# Patient Record
Sex: Male | Born: 1975
Health system: Southern US, Community
[De-identification: ages and names within clinical notes are randomized; demographics above are authoritative.]

## PROBLEM LIST (undated history)

## (undated) DIAGNOSIS — T63441A Toxic effect of venom of bees, accidental (unintentional), initial encounter: Secondary | ICD-10-CM

## (undated) HISTORY — PX: APPENDECTOMY: SHX54

---

## 2012-03-14 HISTORY — PX: ABCESS DRAINAGE: SHX399

## 2012-04-05 ENCOUNTER — Emergency Department (HOSPITAL_COMMUNITY)
Admission: EM | Admit: 2012-04-05 | Discharge: 2012-04-06 | Disposition: A | Discharge status: Home / Self Care | Payer: Self-pay | Attending: Emergency Medicine | Admitting: Emergency Medicine

## 2012-04-05 ENCOUNTER — Encounter (HOSPITAL_COMMUNITY): Payer: Self-pay | Admitting: Family Medicine

## 2012-04-05 DIAGNOSIS — Z79899 Other long term (current) drug therapy: Secondary | ICD-10-CM | POA: Insufficient documentation

## 2012-04-05 DIAGNOSIS — L02211 Cutaneous abscess of abdominal wall: Secondary | ICD-10-CM

## 2012-04-05 DIAGNOSIS — F17200 Nicotine dependence, unspecified, uncomplicated: Secondary | ICD-10-CM | POA: Insufficient documentation

## 2012-04-05 DIAGNOSIS — IMO0001 Reserved for inherently not codable concepts without codable children: Secondary | ICD-10-CM | POA: Insufficient documentation

## 2012-04-05 DIAGNOSIS — R509 Fever, unspecified: Secondary | ICD-10-CM | POA: Insufficient documentation

## 2012-04-05 DIAGNOSIS — L02219 Cutaneous abscess of trunk, unspecified: Secondary | ICD-10-CM | POA: Insufficient documentation

## 2012-04-05 DIAGNOSIS — R109 Unspecified abdominal pain: Secondary | ICD-10-CM | POA: Insufficient documentation

## 2012-04-05 MED ORDER — SODIUM CHLORIDE 0.9 % IV SOLN
Freq: Once | INTRAVENOUS | Status: AC
  Administered 2012-04-06: via INTRAVENOUS

## 2012-04-05 MED ORDER — VANCOMYCIN HCL IN DEXTROSE 1-5 GM/200ML-% IV SOLN
1000.0000 mg | Freq: Once | INTRAVENOUS | Status: AC
  Administered 2012-04-06: 1000 mg via INTRAVENOUS
  Filled 2012-04-05: qty 200

## 2012-04-05 MED ORDER — ONDANSETRON HCL 4 MG/2ML IJ SOLN
4.0000 mg | Freq: Once | INTRAMUSCULAR | Status: AC
  Administered 2012-04-06: 4 mg via INTRAVENOUS
  Filled 2012-04-05: qty 2

## 2012-04-05 MED ORDER — HYDROMORPHONE HCL PF 1 MG/ML IJ SOLN
1.0000 mg | Freq: Once | INTRAMUSCULAR | Status: AC
  Administered 2012-04-06: 1 mg via INTRAVENOUS
  Filled 2012-04-05: qty 1

## 2012-04-05 NOTE — ED Provider Notes (Signed)
History     CSN: [id number]  Arrival date & time 04/05/12  2115   First MD Initiated Contact with Patient 04/05/12 2255      Chief Complaint  Patient presents with  . Abscess    (Consider location/radiation/quality/duration/timing/severity/associated sxs/prior treatment) HPI Comments: Patient was initially treated October 8 for a abscess on his left forearm, which is I indeed, he returns to [facility] on the 21st with numerous other small punctate lesions, and a larger abscess in the anterior midline.  Abdomen has become very painful.  His girlfriend opened up the superficial with some drainage.  Pain, has increased and patient now has myalgias, and fevers.  Has been unable to go to work for the past 2, days, 2 to extreme fatigue, and fever to 101, despite taking the second course of Septra  Patient is a 36 y.o. male presenting with abscess. The history is provided by the patient.  Abscess  This is a recurrent problem. The current episode started more than one week ago. The problem has been gradually worsening. The abscess is present on the abdomen. The abscess is characterized by itchiness and swelling. The patient was exposed to antibiotics. Associated symptoms include a fever. Pertinent negatives include no diarrhea and no vomiting.    History reviewed. No pertinent past medical history.  Past Surgical History  Procedure Date  . Appendectomy     No family history on file.  History  Substance Use Topics  . Smoking status: Current Every Day Smoker -- 0.5 packs/day    Types: Cigarettes  . Smokeless tobacco: Not on file  . Alcohol Use: No      Review of Systems  Constitutional: Positive for fever, chills and activity change.  Gastrointestinal: Positive for abdominal pain. Negative for vomiting, diarrhea and constipation.  Genitourinary: Negative for dysuria.  Musculoskeletal: Positive for myalgias.  Skin: Positive for wound.  Neurological: Negative for dizziness.     Allergies  Amoxicillin; Bee venom; and Penicillins  Home Medications   Current Outpatient Rx  Name Route Sig Dispense Refill  . HYDROCODONE-ACETAMINOPHEN 5-325 MG PO TABS Oral Take 1 tablet by mouth every 6 (six) hours as needed. pain    . SULFAMETHOXAZOLE-TMP DS 800-160 MG PO TABS Oral Take 1 tablet by mouth 2 (two) times daily.    Marland Kitchen MINOCYCLINE HCL 100 MG PO CAPS Oral Take 1 capsule (100 mg total) by mouth 2 (two) times daily. 14 capsule 0  . OXYCODONE-ACETAMINOPHEN 5-325 MG PO TABS Oral Take 1-2 tablets by mouth every 6 (six) hours as needed for pain. 20 tablet 0    BP 135/87  Pulse 79  Temp 98.3 F (36.8 C) (Oral)  Resp 20  SpO2 98%  Physical Exam  Constitutional: He appears well-developed and well-nourished.  HENT:  Head: Normocephalic.  Eyes: Pupils are equal, round, and reactive to light.  Neck: Normal range of motion.  Cardiovascular: Normal rate.   Pulmonary/Chest: Effort normal.  Abdominal: Soft. He exhibits no distension. There is tenderness.    Musculoskeletal: Normal range of motion.  Neurological: He is alert.  Skin:       Numerous small, punctate superficial abscesses along the lateral aspect of the left upper arm, left forearm, anterior left thigh    ED Course  INCISION AND DRAINAGE Date/Time: 04/06/2012 2:09 AM Performed by: Arman Filter Authorized by: Arman Filter Consent: Verbal consent obtained. Risks and benefits: risks, benefits and alternatives were discussed Consent given by: patient Patient understanding: patient states understanding of  the procedure being performed Patient identity confirmed: verbally with patient Time out: Immediately prior to procedure a "time out" was called to verify the correct patient, procedure, equipment, support staff and site/side marked as required. Type: abscess Body area: trunk Location details: abdomen Anesthesia: local infiltration Local anesthetic: lidocaine 1% without epinephrine Anesthetic  total: 5 ml Patient sedated: no Scalpel size: 11 Needle gauge: 20 Incision type: elliptical Complexity: simple Drainage: purulent Drainage amount: moderate Wound treatment: wound left open Patient tolerance: Patient tolerated the procedure well with no immediate complications.   (including critical care time)  Labs Reviewed  POCT I-STAT, CHEM 8 - Abnormal; Notable for the following:    Potassium 5.5 (*)     Glucose, Bld 108 (*)     Calcium, Ion 1.10 (*)     All other components within normal limits  CBC  POTASSIUM   No results found.   1. Abscess of abdominal wall       MDM  Maisie Fus is I indeed, drained.  Moderate amount of pertinent material removed.  Left open.  I have added medicine to his Septra was encouraged patient to finish taking both.  Also given him Percocet for pain         Arman Filter, NP 04/06/12 [phone]

## 2012-04-05 NOTE — ED Notes (Addendum)
Patient here for evaluation of multiple abscesses. Was seen at [facility] on 10/8 and again on 10/21. Was given Rx for Bactrim for 2 weeks. Completed one course of Bactrim and and was restarted on Bactrim again the the 21st. Raised area to lower abdomen with purulent center. Abscesses to left thigh and left forearm. Denies fever but reports pain, fatigue and weakness.

## 2012-04-06 LAB — CBC
Hemoglobin: 15.2 g/dL (ref 13.0–17.0)
MCH: 30.5 pg (ref 26.0–34.0)
MCV: 87.6 fL (ref 78.0–100.0)
RBC: 4.99 MIL/uL (ref 4.22–5.81)

## 2012-04-06 LAB — POCT I-STAT, CHEM 8
BUN: 6 mg/dL (ref 6–23)
Calcium, Ion: 1.1 mmol/L — ABNORMAL LOW (ref 1.12–1.23)
Creatinine, Ser: 0.9 mg/dL (ref 0.50–1.35)
TCO2: 25 mmol/L (ref 0–100)

## 2012-04-06 MED ORDER — OXYCODONE-ACETAMINOPHEN 5-325 MG PO TABS: 1.0000 | ORAL_TABLET | Freq: Four times a day (QID) | ORAL | Status: DC | PRN

## 2012-04-06 MED ORDER — MINOCYCLINE HCL 100 MG PO CAPS: 100.0000 mg | ORAL_CAPSULE | Freq: Two times a day (BID) | ORAL | Status: DC

## 2012-04-06 MED ORDER — LORAZEPAM 2 MG/ML IJ SOLN: 1.0000 mg | Freq: Once | INTRAMUSCULAR | Status: DC

## 2012-04-06 NOTE — ED Provider Notes (Signed)
Medical screening examination/treatment/procedure(s) were performed by non-physician practitioner and as supervising physician I was immediately available for consultation/collaboration.   Kwana Ringel L Cassie Shedlock, MD 04/06/12 0632 

## 2012-04-06 NOTE — ED Notes (Signed)
NP at bedside.

## 2012-08-21 ENCOUNTER — Emergency Department (HOSPITAL_COMMUNITY)
Admission: EM | Admit: 2012-08-21 | Discharge: 2012-08-22 | Disposition: A | Discharge status: Home / Self Care | Payer: Self-pay | Attending: Emergency Medicine | Admitting: Emergency Medicine

## 2012-08-21 ENCOUNTER — Encounter (HOSPITAL_COMMUNITY): Payer: Self-pay | Admitting: *Deleted

## 2012-08-21 DIAGNOSIS — L0291 Cutaneous abscess, unspecified: Secondary | ICD-10-CM

## 2012-08-21 DIAGNOSIS — L02219 Cutaneous abscess of trunk, unspecified: Secondary | ICD-10-CM | POA: Insufficient documentation

## 2012-08-21 DIAGNOSIS — R42 Dizziness and giddiness: Secondary | ICD-10-CM | POA: Insufficient documentation

## 2012-08-21 DIAGNOSIS — L03319 Cellulitis of trunk, unspecified: Secondary | ICD-10-CM | POA: Insufficient documentation

## 2012-08-21 DIAGNOSIS — F17200 Nicotine dependence, unspecified, uncomplicated: Secondary | ICD-10-CM | POA: Insufficient documentation

## 2012-08-21 NOTE — ED Notes (Signed)
Pt complains of headaches with dizziness for the past week. Denies vomiting but complains of nausea. Also pt notices a sore, tender area to the LLQ/upper left pelvic area. Complains of redness and edematous in nature as well. Green drainage noted by pt and family

## 2012-08-22 MED ORDER — IBUPROFEN 800 MG PO TABS
800.0000 mg | ORAL_TABLET | Freq: Once | ORAL | Status: AC
  Administered 2012-08-22: 800 mg via ORAL
  Filled 2012-08-22: qty 1

## 2012-08-22 MED ORDER — OXYCODONE-ACETAMINOPHEN 5-325 MG PO TABS
2.0000 | ORAL_TABLET | Freq: Once | ORAL | Status: AC
  Administered 2012-08-22: 2 via ORAL
  Filled 2012-08-22: qty 2

## 2012-08-22 MED ORDER — OXYCODONE-ACETAMINOPHEN 5-325 MG PO TABS: 2.0000 | ORAL_TABLET | Freq: Four times a day (QID) | ORAL | Status: DC | PRN
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
## 2012-08-22 NOTE — ED Notes (Signed)
NP at bedside.

## 2012-08-22 NOTE — ED Provider Notes (Signed)
Medical screening examination/treatment/procedure(s) were performed by non-physician practitioner and as supervising physician I was immediately available for consultation/collaboration.  Nechama Escutia, MD 08/22/12 0920 

## 2013-01-15 ENCOUNTER — Emergency Department (HOSPITAL_COMMUNITY)
Admission: EM | Admit: 2013-01-15 | Discharge: 2013-01-15 | Disposition: A | Discharge status: Home / Self Care | Payer: Self-pay | Attending: Emergency Medicine | Admitting: Emergency Medicine

## 2013-01-15 ENCOUNTER — Encounter (HOSPITAL_COMMUNITY): Payer: Self-pay

## 2013-01-15 DIAGNOSIS — W208XXA Other cause of strike by thrown, projected or falling object, initial encounter: Secondary | ICD-10-CM | POA: Insufficient documentation

## 2013-01-15 DIAGNOSIS — F17200 Nicotine dependence, unspecified, uncomplicated: Secondary | ICD-10-CM | POA: Insufficient documentation

## 2013-01-15 DIAGNOSIS — IMO0002 Reserved for concepts with insufficient information to code with codable children: Secondary | ICD-10-CM | POA: Insufficient documentation

## 2013-01-15 DIAGNOSIS — Z880 Allergy status to penicillin: Secondary | ICD-10-CM | POA: Insufficient documentation

## 2013-01-15 DIAGNOSIS — Z9889 Other specified postprocedural states: Secondary | ICD-10-CM | POA: Insufficient documentation

## 2013-01-15 DIAGNOSIS — S39011A Strain of muscle, fascia and tendon of abdomen, initial encounter: Secondary | ICD-10-CM

## 2013-01-15 DIAGNOSIS — Y9389 Activity, other specified: Secondary | ICD-10-CM | POA: Insufficient documentation

## 2013-01-15 DIAGNOSIS — S298XXA Other specified injuries of thorax, initial encounter: Secondary | ICD-10-CM | POA: Insufficient documentation

## 2013-01-15 DIAGNOSIS — Y9269 Other specified industrial and construction area as the place of occurrence of the external cause: Secondary | ICD-10-CM | POA: Insufficient documentation

## 2013-01-15 MED ORDER — IBUPROFEN 800 MG PO TABS: 800.0000 mg | ORAL_TABLET | Freq: Three times a day (TID) | ORAL | Status: DC

## 2013-01-15 MED ORDER — OXYCODONE-ACETAMINOPHEN 5-325 MG PO TABS: 2.0000 | ORAL_TABLET | ORAL | Status: DC | PRN

## 2013-01-15 MED ORDER — METHOCARBAMOL 500 MG PO TABS: 500.0000 mg | ORAL_TABLET | Freq: Three times a day (TID) | ORAL | Status: DC | PRN

## 2013-01-15 MED ORDER — MORPHINE SULFATE 10 MG/ML IJ SOLN
10.0000 mg | Freq: Once | INTRAMUSCULAR | Status: AC
  Administered 2013-01-15: 10 mg via INTRAMUSCULAR
  Filled 2013-01-15: qty 1

## 2013-01-15 MED ORDER — ONDANSETRON 4 MG PO TBDP
4.0000 mg | ORAL_TABLET | Freq: Once | ORAL | Status: AC
  Administered 2013-01-15: 4 mg via ORAL
  Filled 2013-01-15: qty 1

## 2013-01-15 NOTE — ED Notes (Signed)
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

## 2013-01-15 NOTE — ED Provider Notes (Signed)
  CSN: [id number]     Arrival date & time 01/15/13  1941 History     First MD Initiated Contact with Patient 01/15/13 1956     Chief Complaint  Patient presents with  . Flank Pain   ( HPI  Pt works doing Aeronautical engineer.  Holding 10', 30 plb pruner upright 2 days ago.  Tool fell to pts left, and he resisted, and felt pain and burning ir RUQ sub-costal abd.  Pain with cough, bend, twist.  No symptoms prior to injury.  No cough, fever, food intolerance. No rash or vesicles.  History reviewed. No pertinent past medical history. Past Surgical History  Procedure Laterality Date  . Appendectomy    . Abcess drainage  03/2012   History reviewed. No pertinent family history. History  Substance Use Topics  . Smoking status: Current Every Day Smoker -- 0.50 packs/day for 20 years    Types: Cigarettes  . Smokeless tobacco: Not on file  . Alcohol Use: No    Review of Systems  Cardiovascular: Positive for chest pain.  Gastrointestinal: Positive for abdominal pain. Negative for nausea and diarrhea.  Musculoskeletal: Positive for myalgias. Negative for back pain and gait problem.  Skin: Positive for rash.  Neurological: Negative for weakness and numbness.    Allergies  Amoxicillin; Bee venom; and Penicillins  Home Medications   Current Outpatient Rx  Name  Route  Sig  Dispense  Refill  . ibuprofen (ADVIL,MOTRIN) 800 MG tablet   Oral   Take 1 tablet (800 mg total) by mouth 3 (three) times daily.   21 tablet   0   . methocarbamol (ROBAXIN) 500 MG tablet   Oral   Take 1 tablet (500 mg total) by mouth 3 (three) times daily between meals as needed.   20 tablet   0   . oxyCODONE-acetaminophen (PERCOCET/ROXICET) 5-325 MG per tablet   Oral   Take 2 tablets by mouth every 4 (four) hours as needed for pain.   6 tablet   0    BP 140/93  Pulse 102  Temp(Src) 98.5 F (36.9 C) (Oral)  Resp 20  Ht 5\' 8"  (1.727 m)  Wt 200 lb (90.719 kg)  BMI 30.42 kg/m2  SpO2 99% Physical Exam    Constitutional: He is oriented to person, place, and time. He appears well-developed and well-nourished. No distress.  HENT:  Head: Normocephalic.  Eyes: Conjunctivae are normal. Pupils are equal, round, and reactive to light. No scleral icterus.  Neck: Normal range of motion. Neck supple.  Cardiovascular: Normal rate and regular rhythm.  Exam reveals no gallop and no friction rub.   No murmur heard. Pulmonary/Chest: Effort normal and breath sounds normal. No respiratory distress. He has no wheezes. He has no rales.  Abdominal: Soft. Bowel sounds are normal. He exhibits no distension. There is tenderness. There is no rebound.    Musculoskeletal: Normal range of motion.  Neurological: He is alert and oriented to person, place, and time.  Skin: Skin is warm and dry. No rash noted.  Psychiatric: He has a normal mood and affect. His behavior is normal.    ED Course   Procedures (including critical care time)  Labs Reviewed - No data to display No results found. 1. Strain of abdominal wall, initial encounter     MDM  No studies indicated.  History and exam consistent with oblique/abd wall strain.  Claudean Kinds, MD 01/15/13 2041

## 2013-06-10 ENCOUNTER — Emergency Department (HOSPITAL_COMMUNITY): Payer: Self-pay

## 2013-06-10 ENCOUNTER — Emergency Department (HOSPITAL_COMMUNITY)
Admission: EM | Admit: 2013-06-10 | Discharge: 2013-06-11 | Disposition: A | Discharge status: Home / Self Care | Payer: Self-pay | Attending: Emergency Medicine | Admitting: Emergency Medicine

## 2013-06-10 ENCOUNTER — Encounter (HOSPITAL_COMMUNITY): Payer: Self-pay | Admitting: Emergency Medicine

## 2013-06-10 DIAGNOSIS — S0993XA Unspecified injury of face, initial encounter: Secondary | ICD-10-CM | POA: Insufficient documentation

## 2013-06-10 DIAGNOSIS — R1310 Dysphagia, unspecified: Secondary | ICD-10-CM | POA: Insufficient documentation

## 2013-06-10 DIAGNOSIS — Y9383 Activity, rough housing and horseplay: Secondary | ICD-10-CM | POA: Insufficient documentation

## 2013-06-10 DIAGNOSIS — Z880 Allergy status to penicillin: Secondary | ICD-10-CM | POA: Insufficient documentation

## 2013-06-10 DIAGNOSIS — R609 Edema, unspecified: Secondary | ICD-10-CM | POA: Insufficient documentation

## 2013-06-10 DIAGNOSIS — IMO0002 Reserved for concepts with insufficient information to code with codable children: Secondary | ICD-10-CM | POA: Insufficient documentation

## 2013-06-10 DIAGNOSIS — S199XXA Unspecified injury of neck, initial encounter: Secondary | ICD-10-CM | POA: Insufficient documentation

## 2013-06-10 DIAGNOSIS — F17200 Nicotine dependence, unspecified, uncomplicated: Secondary | ICD-10-CM | POA: Insufficient documentation

## 2013-06-10 DIAGNOSIS — Y92009 Unspecified place in unspecified non-institutional (private) residence as the place of occurrence of the external cause: Secondary | ICD-10-CM | POA: Insufficient documentation

## 2013-06-10 LAB — COMPREHENSIVE METABOLIC PANEL
AST: 23 U/L (ref 0–37)
BUN: 10 mg/dL (ref 6–23)
CO2: 28 mEq/L (ref 19–32)
Calcium: 9.4 mg/dL (ref 8.4–10.5)
Creatinine, Ser: 0.71 mg/dL (ref 0.50–1.35)
GFR calc non Af Amer: >90 mL/min (ref >90)

## 2013-06-10 LAB — CBC WITH DIFFERENTIAL/PLATELET
Basophils Absolute: 0 10*3/uL (ref 0.0–0.1)
Basophils Relative: 0 % (ref 0–1)
Eosinophils Relative: 2 % (ref 0–5)
HCT: 45.1 % (ref 39.0–52.0)
Lymphocytes Relative: 17 % (ref 12–46)
MCHC: 34.6 g/dL (ref 30.0–36.0)
MCV: 89.5 fL (ref 78.0–100.0)
Monocytes Absolute: 1.2 10*3/uL — ABNORMAL HIGH (ref 0.1–1.0)
RDW: 13.5 % (ref 11.5–15.5)

## 2013-06-10 MED ORDER — MORPHINE SULFATE 4 MG/ML IJ SOLN
4.0000 mg | Freq: Once | INTRAMUSCULAR | Status: AC
  Administered 2013-06-10: 4 mg via INTRAVENOUS
  Filled 2013-06-10: qty 1

## 2013-06-10 MED ORDER — IOHEXOL 350 MG/ML SOLN
100.0000 mL | Freq: Once | INTRAVENOUS | Status: AC | PRN
  Administered 2013-06-10: 100 mL via INTRAVENOUS

## 2013-06-10 MED ORDER — ONDANSETRON HCL 4 MG/2ML IJ SOLN
4.0000 mg | Freq: Once | INTRAMUSCULAR | Status: AC
  Administered 2013-06-10: 4 mg via INTRAVENOUS
  Filled 2013-06-10: qty 2
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
## 2013-06-10 NOTE — ED Provider Notes (Signed)
Medical screening examination/treatment/procedure(s) were conducted as a shared visit with non-physician practitioner(s) and myself.  I personally evaluated the patient during the encounter.  EKG Interpretation   None      No stridor drooling or dysphasia normal phonation no shortness of breath no change in vision no strokelike symptoms but does have tender hematoma right anterior triangle of neck with midline trachea  Hurman Horn, MD 06/11/13 2028
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
## 2013-06-11 MED ORDER — HYDROCODONE-ACETAMINOPHEN 5-325 MG PO TABS: 2.0000 | ORAL_TABLET | ORAL | Status: DC | PRN

## 2013-12-07 ENCOUNTER — Emergency Department (HOSPITAL_COMMUNITY)
Admission: EM | Admit: 2013-12-07 | Discharge: 2013-12-07 | Disposition: A | Discharge status: Home / Self Care | Payer: Self-pay | Attending: Emergency Medicine | Admitting: Emergency Medicine

## 2013-12-07 ENCOUNTER — Encounter (HOSPITAL_COMMUNITY): Payer: Self-pay | Admitting: Emergency Medicine

## 2013-12-07 DIAGNOSIS — T63461A Toxic effect of venom of wasps, accidental (unintentional), initial encounter: Secondary | ICD-10-CM | POA: Insufficient documentation

## 2013-12-07 DIAGNOSIS — Y9389 Activity, other specified: Secondary | ICD-10-CM | POA: Insufficient documentation

## 2013-12-07 DIAGNOSIS — R21 Rash and other nonspecific skin eruption: Secondary | ICD-10-CM | POA: Insufficient documentation

## 2013-12-07 DIAGNOSIS — T7840XA Allergy, unspecified, initial encounter: Secondary | ICD-10-CM

## 2013-12-07 DIAGNOSIS — Z880 Allergy status to penicillin: Secondary | ICD-10-CM | POA: Insufficient documentation

## 2013-12-07 DIAGNOSIS — Y9289 Other specified places as the place of occurrence of the external cause: Secondary | ICD-10-CM | POA: Insufficient documentation

## 2013-12-07 DIAGNOSIS — T6391XA Toxic effect of contact with unspecified venomous animal, accidental (unintentional), initial encounter: Secondary | ICD-10-CM | POA: Insufficient documentation

## 2013-12-07 DIAGNOSIS — Z881 Allergy status to other antibiotic agents status: Secondary | ICD-10-CM | POA: Insufficient documentation

## 2013-12-07 DIAGNOSIS — IMO0002 Reserved for concepts with insufficient information to code with codable children: Secondary | ICD-10-CM | POA: Insufficient documentation

## 2013-12-07 DIAGNOSIS — Z79899 Other long term (current) drug therapy: Secondary | ICD-10-CM | POA: Insufficient documentation

## 2013-12-07 DIAGNOSIS — F17200 Nicotine dependence, unspecified, uncomplicated: Secondary | ICD-10-CM | POA: Insufficient documentation

## 2013-12-07 HISTORY — DX: Toxic effect of venom of bees, accidental (unintentional), initial encounter: T63.441A

## 2013-12-07 MED ORDER — EPINEPHRINE 0.3 MG/0.3ML IJ SOAJ: 0.3000 mg | INTRAMUSCULAR | Status: AC | PRN

## 2013-12-07 MED ORDER — FAMOTIDINE IN NACL 20-0.9 MG/50ML-% IV SOLN
20.0000 mg | Freq: Once | INTRAVENOUS | Status: AC
  Administered 2013-12-07: 20 mg via INTRAVENOUS
  Filled 2013-12-07: qty 50

## 2013-12-07 MED ORDER — FAMOTIDINE 20 MG PO TABS: 20.0000 mg | ORAL_TABLET | Freq: Two times a day (BID) | ORAL | Status: DC

## 2013-12-07 MED ORDER — PREDNISONE 50 MG PO TABS: ORAL_TABLET | ORAL | Status: DC

## 2013-12-07 MED ORDER — METHYLPREDNISOLONE SODIUM SUCC 125 MG IJ SOLR
125.0000 mg | Freq: Once | INTRAMUSCULAR | Status: AC
Start: 2013-12-07 — End: 2013-12-07
  Administered 2013-12-07: 125 mg via INTRAVENOUS
  Filled 2013-12-07: qty 2

## 2013-12-07 MED ORDER — EPINEPHRINE 0.3 MG/0.3ML IJ SOAJ
0.3000 mg | Freq: Once | INTRAMUSCULAR | Status: DC
  Filled 2013-12-07: qty 0.3

## 2013-12-07 MED ORDER — DIPHENHYDRAMINE HCL 50 MG/ML IJ SOLN
25.0000 mg | Freq: Once | INTRAMUSCULAR | Status: AC
  Administered 2013-12-07: 25 mg via INTRAVENOUS
  Filled 2013-12-07: qty 1

## 2013-12-07 MED ORDER — EPINEPHRINE 0.3 MG/0.3ML IJ SOAJ
INTRAMUSCULAR | Status: AC
  Administered 2013-12-07: 0.3 mg
  Filled 2013-12-07: qty 0.3

## 2013-12-07 MED ORDER — SODIUM CHLORIDE 0.9 % IV BOLUS (SEPSIS)
1000.0000 mL | Freq: Once | INTRAVENOUS | Status: AC
  Administered 2013-12-07: 1000 mL via INTRAVENOUS

## 2013-12-07 MED ORDER — DIPHENHYDRAMINE HCL 25 MG PO TABS: 25.0000 mg | ORAL_TABLET | Freq: Four times a day (QID) | ORAL | Status: DC

## 2013-12-07 NOTE — Discharge Instructions (Signed)
Anaphylactic Reaction °Take the steroids antihistamines as prescribed. followup with your doctor. Use the epinephrine pen as needed for severe allergic reaction. If you use the epinephrine pain, you must come to the hospital. Return to the ED if you develop or worsening symptoms. °An anaphylactic reaction is a sudden, severe allergic reaction that involves the whole body. It can be life threatening. A hospital stay is often required. People with asthma, eczema, or hay fever are slightly more likely to have an anaphylactic reaction. °CAUSES  °An anaphylactic reaction may be caused by anything to which you are allergic. After being exposed to the allergic substance, your immune system becomes sensitized to it. When you are exposed to that allergic substance again, an allergic reaction can occur. Common causes of an anaphylactic reaction include: °· Medicines. °· Foods, especially peanuts, wheat, shellfish, milk, and eggs. °· Insect bites or stings. °· Blood products. °· Chemicals, such as dyes, latex, and contrast material used for imaging tests. °SYMPTOMS  °When an allergic reaction occurs, the body releases histamine and other substances. These substances cause symptoms such as tightening of the airway. Symptoms often develop within seconds or minutes of exposure. Symptoms may include: °· Skin rash or hives. °· Itching. °· Chest tightness. °· Swelling of the eyes, tongue, or lips. °· Trouble breathing or swallowing. °· Lightheadedness or fainting. °· Anxiety or confusion. °· Stomach pains, vomiting, or diarrhea. °· Nasal congestion. °· A fast or irregular heartbeat (palpitations). °DIAGNOSIS  °Diagnosis is based on your history of recent exposure to allergic substances, your symptoms, and a physical exam. Your caregiver may also perform blood or urine tests to confirm the diagnosis. °TREATMENT  °Epinephrine medicine is the main treatment for an anaphylactic reaction. Other medicines that may be used for treatment  include antihistamines, steroids, and albuterol. In severe cases, fluids and medicine to support blood pressure may be given through an intravenous line (IV). Even if you improve after treatment, you need to be observed to make sure your condition does not get worse. This may require a stay in the hospital. °HOME CARE INSTRUCTIONS  °· Wear a medical alert bracelet or necklace stating your allergy. °· You and your family must learn how to use an anaphylaxis kit or give an epinephrine injection to temporarily treat an emergency allergic reaction. Always carry your epinephrine injection or anaphylaxis kit with you. This can be lifesaving if you have a severe reaction. °· Do not drive or perform tasks after treatment until the medicines used to treat your reaction have worn off, or until your caregiver says it is okay. °· If you have hives or a rash: °¨ Take medicines as directed by your caregiver. °¨ You may use an over-the-counter antihistamine (diphenhydramine) as needed. °¨ Apply cold compresses to the skin or take baths in cool water. Avoid hot baths or showers. °SEEK MEDICAL CARE IF:  °· You develop symptoms of an allergic reaction to a new substance. Symptoms may start right away or minutes later. °· You develop a rash, hives, or itching. °· You develop new symptoms. °SEEK IMMEDIATE MEDICAL CARE IF:  °· You have swelling of the mouth, difficulty breathing, or wheezing. °· You have a tight feeling in your chest or throat. °· You develop hives, swelling, or itching all over your body. °· You develop severe vomiting or diarrhea. °· You feel faint or pass out. °This is an emergency. Use your epinephrine injection or anaphylaxis kit as you have been instructed. Call your local emergency services (911   in [place] Even if you improve after the injection, you need to be examined at a hospital emergency department. °MAKE SURE YOU:  °· Understand these instructions. °· Will watch your condition. °· Will get help right away  if you are not doing well or get worse. °Document Released: 05/31/2005 Document Revised: 06/05/2013 Document Reviewed: 09/01/2011 °[employer]® Patient Information ©2015 [employer]. This information is not intended to replace advice given to you by your health care provider. Make sure you discuss any questions you have with your health care provider. ° °
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
## 2013-12-07 NOTE — ED Provider Notes (Signed)
CSN: [id number]     Arrival date & time 12/07/13  1021 History   First MD Initiated Contact with Patient 12/07/13 1025     Chief Complaint  Patient presents with  . Allergic Reaction     (Consider location/radiation/quality/duration/timing/severity/associated sxs/prior Treatment) HPI Comments: Patient presents after bee sting 20 minutes ago. 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 He did not use his EpiPen because it was expired. He complains of tightness in his throat and generalized itching. Denies any chest pain or shortness of breath. Denies any fever or vomiting. Previous reactions include throat swelling and rash.  The history is provided by the patient.    Past Medical History  Diagnosis Date  . Allergic reaction to bee sting    Past Surgical History  Procedure Laterality Date  . Appendectomy    . Abcess drainage  03/2012   No family history on file. History  Substance Use Topics  . Smoking status: Current Every Day Smoker -- 0.50 packs/day for 20 years    Types: Cigarettes  . Smokeless tobacco: Not on file  . Alcohol Use: No    Review of Systems  Constitutional: Negative for fever, activity change and appetite change.  Respiratory: Negative for cough, chest tightness and shortness of breath.   Cardiovascular: Negative for chest pain.  Gastrointestinal: Negative for nausea, vomiting and abdominal pain.  Musculoskeletal: Negative for arthralgias and myalgias.  Skin: Positive for rash.  Neurological: Negative for dizziness, weakness and headaches.  A complete 10 system review of systems was obtained and all systems are negative except as noted in the HPI and PMH.      Allergies  Amoxicillin; Bee venom; and Penicillins  Home Medications   Prior to Admission medications   Medication Sig Start Date End Date Taking? Authorizing Provider  EPINEPHrine (EPIPEN) 0.3 mg/0.3 mL IJ SOAJ injection Inject 0.3 mg into the muscle once.   Yes  Historical Provider, MD  ibuprofen (ADVIL,MOTRIN) 200 MG tablet Take 400 mg by mouth every 6 (six) hours as needed for headache (pain).    Yes Historical Provider, MD  diphenhydrAMINE (BENADRYL) 25 MG tablet Take 1 tablet (25 mg total) by mouth every 6 (six) hours. 12/07/13   Glynn OctaveStephen Rancour, MD  EPINEPHrine (EPIPEN) 0.3 mg/0.3 mL IJ SOAJ injection Inject 0.3 mLs (0.3 mg total) into the muscle as needed. 12/07/13   Glynn OctaveStephen Rancour, MD  famotidine (PEPCID) 20 MG tablet Take 1 tablet (20 mg total) by mouth 2 (two) times daily. 12/07/13   Glynn OctaveStephen Rancour, MD  predniSONE (DELTASONE) 50 MG tablet 1 tablet PO daily 12/07/13   Glynn OctaveStephen Rancour, MD   BP 132/88  Pulse 54  Temp(Src) 98.2 F (36.8 C) (Oral)  Resp 17  SpO2 99% Physical Exam  Nursing note and vitals reviewed. Constitutional: He is oriented to person, place, and time. He appears well-developed and well-nourished. No distress.  HENT:  Head: Normocephalic and atraumatic.  Mouth/Throat: Oropharynx is clear and moist. No oropharyngeal exudate.  OP Clear. No asymmetry. No swelling of lips or tongue  Eyes: Conjunctivae and EOM are normal. Pupils are equal, round, and reactive to light.  Neck: Normal range of motion. Neck supple.  No meningismus.  Cardiovascular: Normal rate, regular rhythm, normal heart sounds and intact distal pulses.   No murmur heard. Pulmonary/Chest: Effort normal and breath sounds normal. No respiratory distress. He has no wheezes.  Abdominal: Soft. There is no tenderness. There is no rebound and no guarding.  Musculoskeletal: Normal range of motion. He exhibits no edema and no tenderness.  Neurological: He is alert and oriented to person, place, and time. No cranial nerve deficit. He exhibits normal muscle tone. Coordination normal.  No ataxia on finger to nose bilaterally. No pronator drift. 5/5 strength throughout. CN 2-12 intact. Negative Romberg. Equal grip strength. Sensation intact. Gait is normal.   Skin: Skin is  warm. Rash noted.  Scattered urticaria to trunk  Psychiatric: He has a normal mood and affect. His behavior is normal.    ED Course  Procedures (including critical care time) Labs Review Labs Reviewed - No data to display  Imaging Review No results found.   EKG Interpretation None      MDM   Final diagnoses:  Allergic reaction, initial encounter   Allergic reaction bee sting. Throat tightness and urticarial rash.  Patient given IM epinephrine, steroids, antihistamines. IV fluids.  Patient monitored in the ED for 3 hours without deterioration in status.  Throat feels better. No chest pain or SOB. No wheezing.  Appears safe for discharge with steroids and antihistamines.  Epi pen and instructions on use provided as well.  Return precautions discussed.  BP 132/88  Pulse 54  Temp(Src) 98.2 F (36.8 C) (Oral)  Resp 17  SpO2 99%   Glynn OctaveStephen Rancour, MD 12/07/13 1818

## 2014-11-12 ENCOUNTER — Emergency Department (HOSPITAL_COMMUNITY)
Admission: EM | Admit: 2014-11-12 | Discharge: 2014-11-12 | Disposition: A | Discharge status: Home / Self Care | Payer: Self-pay | Attending: Emergency Medicine | Admitting: Emergency Medicine

## 2014-11-12 ENCOUNTER — Encounter (HOSPITAL_COMMUNITY): Payer: Self-pay

## 2014-11-12 ENCOUNTER — Emergency Department (HOSPITAL_COMMUNITY): Payer: Self-pay

## 2014-11-12 DIAGNOSIS — Y9289 Other specified places as the place of occurrence of the external cause: Secondary | ICD-10-CM | POA: Insufficient documentation

## 2014-11-12 DIAGNOSIS — Z79899 Other long term (current) drug therapy: Secondary | ICD-10-CM | POA: Insufficient documentation

## 2014-11-12 DIAGNOSIS — Z7952 Long term (current) use of systemic steroids: Secondary | ICD-10-CM | POA: Insufficient documentation

## 2014-11-12 DIAGNOSIS — S93401A Sprain of unspecified ligament of right ankle, initial encounter: Secondary | ICD-10-CM | POA: Insufficient documentation

## 2014-11-12 DIAGNOSIS — Z720 Tobacco use: Secondary | ICD-10-CM | POA: Insufficient documentation

## 2014-11-12 DIAGNOSIS — Z880 Allergy status to penicillin: Secondary | ICD-10-CM | POA: Insufficient documentation

## 2014-11-12 DIAGNOSIS — Y9389 Activity, other specified: Secondary | ICD-10-CM | POA: Insufficient documentation

## 2014-11-12 DIAGNOSIS — Y990 Civilian activity done for income or pay: Secondary | ICD-10-CM | POA: Insufficient documentation

## 2014-11-12 DIAGNOSIS — X58XXXA Exposure to other specified factors, initial encounter: Secondary | ICD-10-CM | POA: Insufficient documentation

## 2014-11-12 MED ORDER — TRAMADOL HCL 50 MG PO TABS
50.0000 mg | ORAL_TABLET | Freq: Once | ORAL | Status: AC
Start: 2014-11-12 — End: 2014-11-12
  Administered 2014-11-12: 50 mg via ORAL
  Filled 2014-11-12: qty 1

## 2014-11-12 MED ORDER — TRAMADOL HCL 50 MG PO TABS
50.0000 mg | ORAL_TABLET | Freq: Four times a day (QID) | ORAL | Status: DC | PRN
Start: 2014-11-12 — End: 2019-12-14

## 2014-11-12 NOTE — ED Notes (Signed)
Pt presents with c/o right foot pain after an injury that occurred last Friday at work. Pt reports pain in the medial area of his ankle, ambulatory to triage.

## 2014-11-12 NOTE — Discharge Instructions (Signed)
Acute Ankle Sprain °with Phase I Rehab °An acute ankle sprain is a partial or complete tear in one or more of the ligaments of the ankle due to traumatic injury. The severity of the injury depends on both the number of ligaments sprained and the grade of sprain. There are 3 grades of sprains.  °· A grade 1 sprain is a mild sprain. There is a slight pull without obvious tearing. There is no loss of strength, and the muscle and ligament are the correct length. °· A grade 2 sprain is a moderate sprain. There is tearing of fibers within the substance of the ligament where it connects two bones or two cartilages. The length of the ligament is increased, and there is usually decreased strength. °· A grade 3 sprain is a complete rupture of the ligament and is uncommon. °In addition to the grade of sprain, there are three types of ankle sprains.  °Lateral ankle sprains: This is a sprain of one or more of the three ligaments on the outer side (lateral) of the ankle. These are the most common sprains. °Medial ankle sprains: There is one large triangular ligament of the inner side (medial) of the ankle that is susceptible to injury. Medial ankle sprains are less common. °Syndesmosis, "high ankle," sprains: The syndesmosis is the ligament that connects the two bones of the lower leg. Syndesmosis sprains usually only occur with very severe ankle sprains. °SYMPTOMS °· Pain, tenderness, and swelling in the ankle, starting at the side of injury that may progress to the whole ankle and foot with time. °· "Pop" or tearing sensation at the time of injury. °· Bruising that may spread to the heel. °· Impaired ability to walk soon after injury. °CAUSES  °· Acute ankle sprains are caused by trauma placed on the ankle that temporarily forces or pries the anklebone (talus) out of its normal socket. °· Stretching or tearing of the ligaments that normally hold the joint in place (usually due to a twisting injury). °RISK INCREASES  WITH: °· Previous ankle sprain. °· Sports in which the foot may land awkwardly (i.e., basketball, volleyball, or soccer) or walking or running on uneven or rough surfaces. °· Shoes with inadequate support to prevent sideways motion when stress occurs. °· Poor strength and flexibility. °· Poor balance skills. °· Contact sports. °PREVENTION  °· Warm up and stretch properly before activity. °· Maintain physical fitness: °¨ Ankle and leg flexibility, muscle strength, and endurance. °¨ Cardiovascular fitness. °· Balance training activities. °· Use proper technique and have a coach correct improper technique. °· Taping, protective strapping, bracing, or high-top tennis shoes may help prevent injury. Initially, tape is best; however, it loses most of its support function within 10 to 15 minutes. °· Wear proper-fitted protective shoes (High-top shoes with taping or bracing is more effective than either alone). °· Provide the ankle with support during sports and practice activities for 12 months following injury. °PROGNOSIS  °· If treated properly, ankle sprains can be expected to recover completely; however, the length of recovery depends on the degree of injury. °· A grade 1 sprain usually heals enough in 5 to 7 days to allow modified activity and requires an average of 6 weeks to heal completely. °· A grade 2 sprain requires 6 to 10 weeks to heal completely. °· A grade 3 sprain requires 12 to 16 weeks to heal. °· A syndesmosis sprain often takes more than 3 months to heal. °RELATED COMPLICATIONS  °· Frequent recurrence of symptoms may   result in a chronic problem. Appropriately addressing the problem the first time decreases the frequency of recurrence and optimizes healing time. Severity of the initial sprain does not predict the likelihood of later instability. °· Injury to other structures (bone, cartilage, or tendon). °· A chronically unstable or arthritic ankle joint is a possibility with repeated  sprains. °TREATMENT °Treatment initially involves the use of ice, medication, and compression bandages to help reduce pain and inflammation. Ankle sprains are usually immobilized in a walking cast or boot to allow for healing. Crutches may be recommended to reduce pressure on the injury. After immobilization, strengthening and stretching exercises may be necessary to regain strength and a full range of motion. Surgery is rarely needed to treat ankle sprains. °MEDICATION  °· Nonsteroidal anti-inflammatory medications, such as aspirin and ibuprofen (do not take for the first 3 days after injury or within 7 days before surgery), or other minor pain relievers, such as acetaminophen, are often recommended. Take these as directed by your caregiver. Contact your caregiver immediately if any bleeding, stomach upset, or signs of an allergic reaction occur from these medications. °· Ointments applied to the skin may be helpful. °· Pain relievers may be prescribed as necessary by your caregiver. Do not take prescription pain medication for longer than 4 to 7 days. Use only as directed and only as much as you need. °HEAT AND COLD °· Cold treatment (icing) is used to relieve pain and reduce inflammation for acute and chronic cases. Cold should be applied for 10 to 15 minutes every 2 to 3 hours for inflammation and pain and immediately after any activity that aggravates your symptoms. Use ice packs or an ice massage. °· Heat treatment may be used before performing stretching and strengthening activities prescribed by your caregiver. Use a heat pack or a warm soak. °SEEK IMMEDIATE MEDICAL CARE IF:  °· Pain, swelling, or bruising worsens despite treatment. °· You experience pain, numbness, discoloration, or coldness in the foot or toes. °· New, unexplained symptoms develop (drugs used in treatment may produce side effects.) °EXERCISES  °PHASE I EXERCISES °RANGE OF MOTION (ROM) AND STRETCHING EXERCISES - Ankle Sprain, Acute Phase I,  Weeks 1 to 2 °These exercises may help you when beginning to restore flexibility in your ankle. You will likely work on these exercises for the 1 to 2 weeks after your injury. Once your physician, physical therapist, or athletic trainer sees adequate progress, he or she will advance your exercises. While completing these exercises, remember:  °· Restoring tissue flexibility helps normal motion to return to the joints. This allows healthier, less painful movement and activity. °· An effective stretch should be held for at least 30 seconds. °· A stretch should never be painful. You should only feel a gentle lengthening or release in the stretched tissue. °RANGE OF MOTION - Dorsi/Plantar Flexion °· While sitting with your right / left knee straight, draw the top of your foot upwards by flexing your ankle. Then reverse the motion, pointing your toes downward. °· Hold each position for __________ seconds. °· After completing your first set of exercises, repeat this exercise with your knee bent. °Repeat __________ times. Complete this exercise __________ times per day.  °RANGE OF MOTION - Ankle Alphabet °· Imagine your right / left big toe is a pen. °· Keeping your hip and knee still, write out the entire alphabet with your "pen." Make the letters as large as you can without increasing any discomfort. °Repeat __________ times. Complete this exercise __________   times per day.  °STRENGTHENING EXERCISES - Ankle Sprain, Acute -Phase I, Weeks 1 to 2 °These exercises may help you when beginning to restore strength in your ankle. You will likely work on these exercises for 1 to 2 weeks after your injury. Once your physician, physical therapist, or athletic trainer sees adequate progress, he or she will advance your exercises. While completing these exercises, remember:  °· Muscles can gain both the endurance and the strength needed for everyday activities through controlled exercises. °· Complete these exercises as instructed by  your physician, physical therapist, or athletic trainer. Progress the resistance and repetitions only as guided. °· You may experience muscle soreness or fatigue, but the pain or discomfort you are trying to eliminate should never worsen during these exercises. If this pain does worsen, stop and make certain you are following the directions exactly. If the pain is still present after adjustments, discontinue the exercise until you can discuss the trouble with your clinician. °STRENGTH - Dorsiflexors °· Secure a rubber exercise band/tubing to a fixed object (i.e., table, pole) and loop the other end around your right / left foot. °· Sit on the floor facing the fixed object. The band/tubing should be slightly tense when your foot is relaxed. °· Slowly draw your foot back toward you using your ankle and toes. °· Hold this position for __________ seconds. Slowly release the tension in the band and return your foot to the starting position. °Repeat __________ times. Complete this exercise __________ times per day.  °STRENGTH - Plantar-flexors  °· Sit with your right / left leg extended. Holding onto both ends of a rubber exercise band/tubing, loop it around the ball of your foot. Keep a slight tension in the band. °· Slowly push your toes away from you, pointing them downward. °· Hold this position for __________ seconds. Return slowly, controlling the tension in the band/tubing. °Repeat __________ times. Complete this exercise __________ times per day.  °STRENGTH - Ankle Eversion °· Secure one end of a rubber exercise band/tubing to a fixed object (table, pole). Loop the other end around your foot just before your toes. °· Place your fists between your knees. This will focus your strengthening at your ankle. °· Drawing the band/tubing across your opposite foot, slowly, pull your little toe out and up. Make sure the band/tubing is positioned to resist the entire motion. °· Hold this position for __________ seconds. °Have  your muscles resist the band/tubing as it slowly pulls your foot back to the starting position.  °Repeat __________ times. Complete this exercise __________ times per day.  °STRENGTH - Ankle Inversion °· Secure one end of a rubber exercise band/tubing to a fixed object (table, pole). Loop the other end around your foot just before your toes. °· Place your fists between your knees. This will focus your strengthening at your ankle. °· Slowly, pull your big toe up and in, making sure the band/tubing is positioned to resist the entire motion. °· Hold this position for __________ seconds. °· Have your muscles resist the band/tubing as it slowly pulls your foot back to the starting position. °Repeat __________ times. Complete this exercises __________ times per day.  °STRENGTH - Towel Curls °· Sit in a chair positioned on a non-carpeted surface. °· Place your right / left foot on a towel, keeping your heel on the floor. °· Pull the towel toward your heel by only curling your toes. Keep your heel on the floor. °· If instructed by your physician, physical therapist,   or athletic trainer, add weight to the end of the towel. °Repeat _____5_____ times. Complete this exercise ______2____ times per day. °Document Released: 12/30/2004 Document Revised: 10/15/2013 Document Reviewed: 09/12/2008 °[employer]® Patient Information ©2015 [employer]. This information is not intended to replace advice given to you by your health care provider. Make sure you discuss any questions you have with your health care provider. ° °

## 2014-11-12 NOTE — ED Provider Notes (Signed)
CSN: [id number]     Arrival date & time 11/12/14  2127 History   This chart was scribed for a non-physician practitioner, Earley Favor, NP working with Derwood Kaplan, MD by Evon Slack, ED Scribe. This patient was seen in room WTR8/WTR8 and the patient's care was started at 11:15 PM.    Chief Complaint  Patient presents with  . Foot Pain   The history is provided by the patient. No language interpreter was used.   HPI Comments: Elijah Arnold is a 39 y.o. male who presents to the Emergency Department complaining of new right foot pain onset 4 days prior. Pt states that he does have some tingling radiating down to his toes. Pt states that he was riding on a standing lawn mower and lost his balance while riding causing him to injure his foot. Pt states that moving the foot makes the pain worse. Pt has applied ice, elevated the foot and taken ibuprofen with no relief.   Past Medical History  Diagnosis Date  . Allergic reaction to bee sting    Past Surgical History  Procedure Laterality Date  . Appendectomy    . Abcess drainage  03/2012   No family history on file. History  Substance Use Topics  . Smoking status: Current Every Day Smoker -- 0.50 packs/day for 20 years    Types: Cigarettes  . Smokeless tobacco: Not on file  . Alcohol Use: No    Review of Systems  Musculoskeletal: Positive for arthralgias.  All other systems reviewed and are negative.     Allergies  Amoxicillin; Bee venom; and Penicillins  Home Medications   Prior to Admission medications   Medication Sig Start Date End Date Taking? Authorizing Provider  diphenhydrAMINE (BENADRYL) 25 MG tablet Take 1 tablet (25 mg total) by mouth every 6 (six) hours. 12/07/13   Glynn Octave, MD  EPINEPHrine (EPIPEN) 0.3 mg/0.3 mL IJ SOAJ injection Inject 0.3 mg into the muscle once.    Historical Provider, MD  EPINEPHrine (EPIPEN) 0.3 mg/0.3 mL IJ SOAJ injection Inject 0.3 mLs (0.3 mg total) into the muscle as needed.  12/07/13   Glynn Octave, MD  famotidine (PEPCID) 20 MG tablet Take 1 tablet (20 mg total) by mouth 2 (two) times daily. 12/07/13   Glynn Octave, MD  ibuprofen (ADVIL,MOTRIN) 200 MG tablet Take 400 mg by mouth every 6 (six) hours as needed for headache (pain).     Historical Provider, MD  predniSONE (DELTASONE) 50 MG tablet 1 tablet PO daily 12/07/13   Glynn Octave, MD  traMADol (ULTRAM) 50 MG tablet Take 1 tablet (50 mg total) by mouth every 6 (six) hours as needed. 11/12/14   Earley Favor, NP   BP 118/78 mmHg  Pulse 77  Temp(Src) 98.6 F (37 C) (Oral)  Resp 20  SpO2 100%   Physical Exam  Constitutional: He is oriented to person, place, and time. He appears well-developed and well-nourished. No distress.  HENT:  Head: Normocephalic and atraumatic.  Eyes: Conjunctivae and EOM are normal.  Neck: Neck supple. No tracheal deviation present.  Cardiovascular: Normal rate.   Pulmonary/Chest: Effort normal. No respiratory distress.  Musculoskeletal: Normal range of motion. He exhibits tenderness. He exhibits no edema.       Feet:  Neurological: He is alert and oriented to person, place, and time.  Skin: Skin is warm and dry.  Psychiatric: He has a normal mood and affect. His behavior is normal.  Nursing note and vitals reviewed.   ED Course  Procedures (including critical care time) DIAGNOSTIC STUDIES: Oxygen Saturation is 100% on RA, normal by my interpretation.    COORDINATION OF CARE: 11:24 PM-Discussed treatment plan with pt at bedside and pt agreed to plan.     Labs Review Labs Reviewed - No data to display  Imaging Review Dg Ankle Complete Right  11/12/2014   CLINICAL DATA:  Lateral right ankle pain following lawnmower accident 4 days ago. Initial encounter.  EXAM: RIGHT ANKLE - COMPLETE 3+ VIEW  COMPARISON:  None.  FINDINGS: There is no evidence of fracture, dislocation, or joint effusion. There is no evidence of arthropathy or other focal bone abnormality. Soft  tissues are unremarkable.  IMPRESSION: Negative.   Electronically Signed   By: Harmon PierJeffrey  Hu M.D.   On: 11/12/2014 22:26   Dg Foot Complete Right  11/12/2014   CLINICAL DATA:  Acute onset of lateral right ankle pain and swelling, after foot caught behind lawnmower. Initial encounter.  EXAM: RIGHT FOOT COMPLETE - 3+ VIEW  COMPARISON:  Right great toe radiographs performed 04/21/2010  FINDINGS: There is no evidence of fracture or dislocation. The joint spaces are preserved. There is no evidence of talar subluxation; the subtalar joint is unremarkable in appearance. A small plantar calcaneal spur is noted.  No significant soft tissue abnormalities are seen.  IMPRESSION: No evidence of fracture or dislocation.   Electronically Signed   By: Roanna RaiderJeffery  Chang M.D.   On: 11/12/2014 22:22     EKG Interpretation None     Will place patient in CAM Walker, pain control and FU with ortho in 7-10 days if not getting better  MDM   Final diagnoses:  Ankle sprain, right, initial encounter     *I personally performed the services described in this documentation, which was scribed in my presence. The recorded information has been reviewed and is accurate.     Earley FavorGail Josely Moffat, NP 11/12/14 [id number]  Derwood KaplanAnkit Nanavati, MD 11/17/14 [id number]

## 2015-10-10 IMAGING — CR DG ANKLE COMPLETE 3+V*R*
3 series · 3 of 3 positions shown · non-contrast
Comparison: None.

CLINICAL DATA: Lateral right ankle pain following lawnmower
accident 4 days ago. Initial encounter.

EXAM:
RIGHT ANKLE - COMPLETE 3+ VIEW

[x ankle ap right]
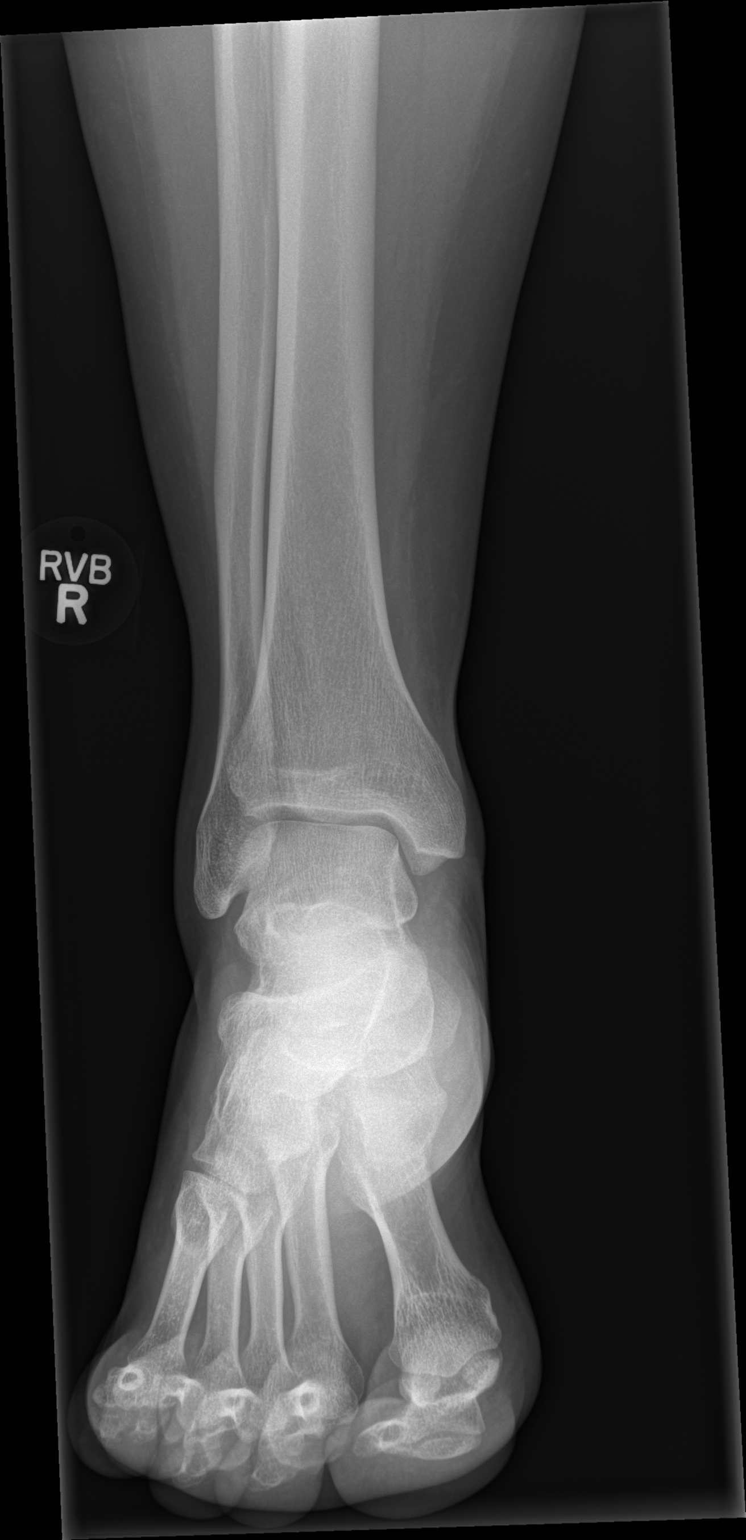

[x ankle obl right]
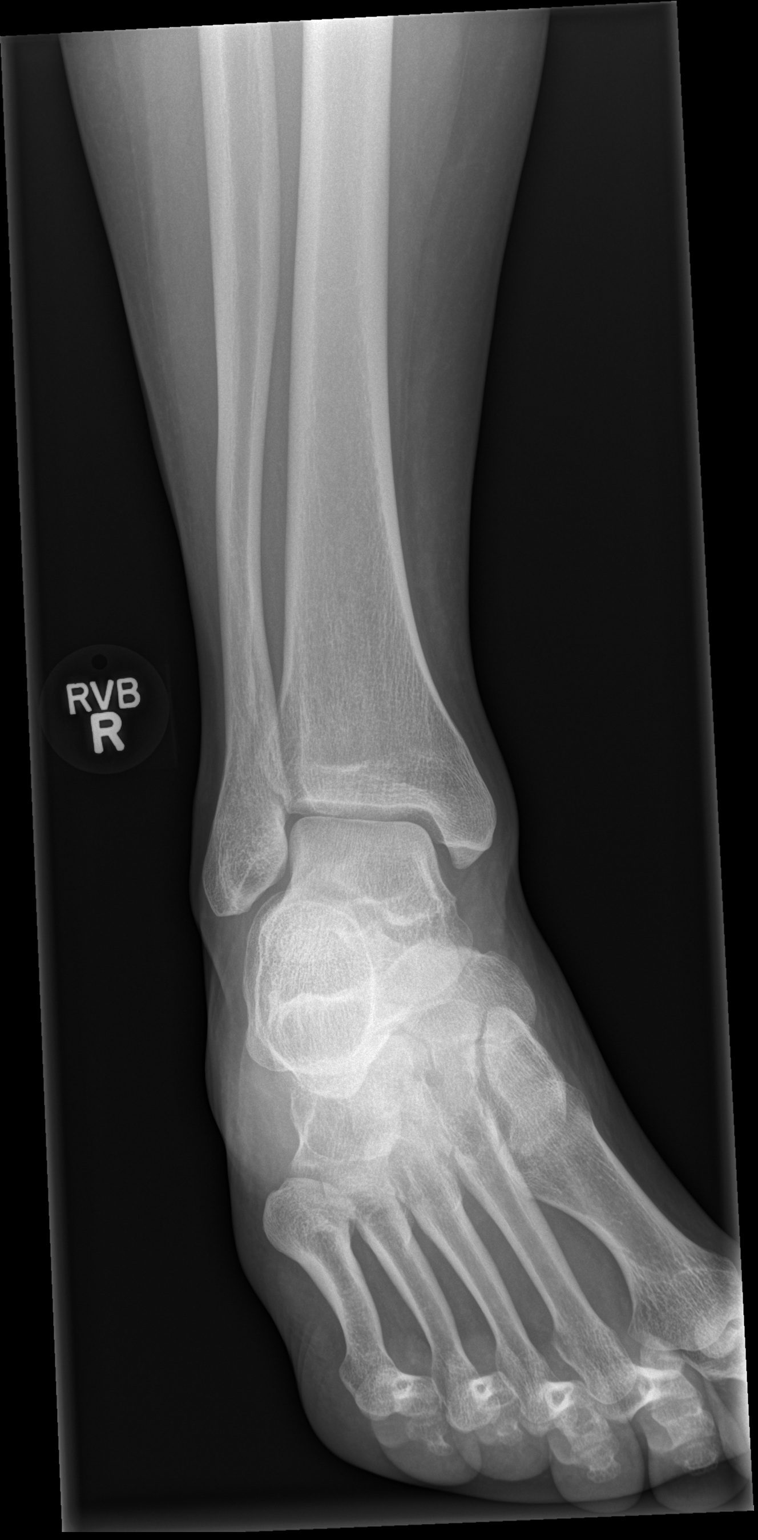

[x ankle lat right]
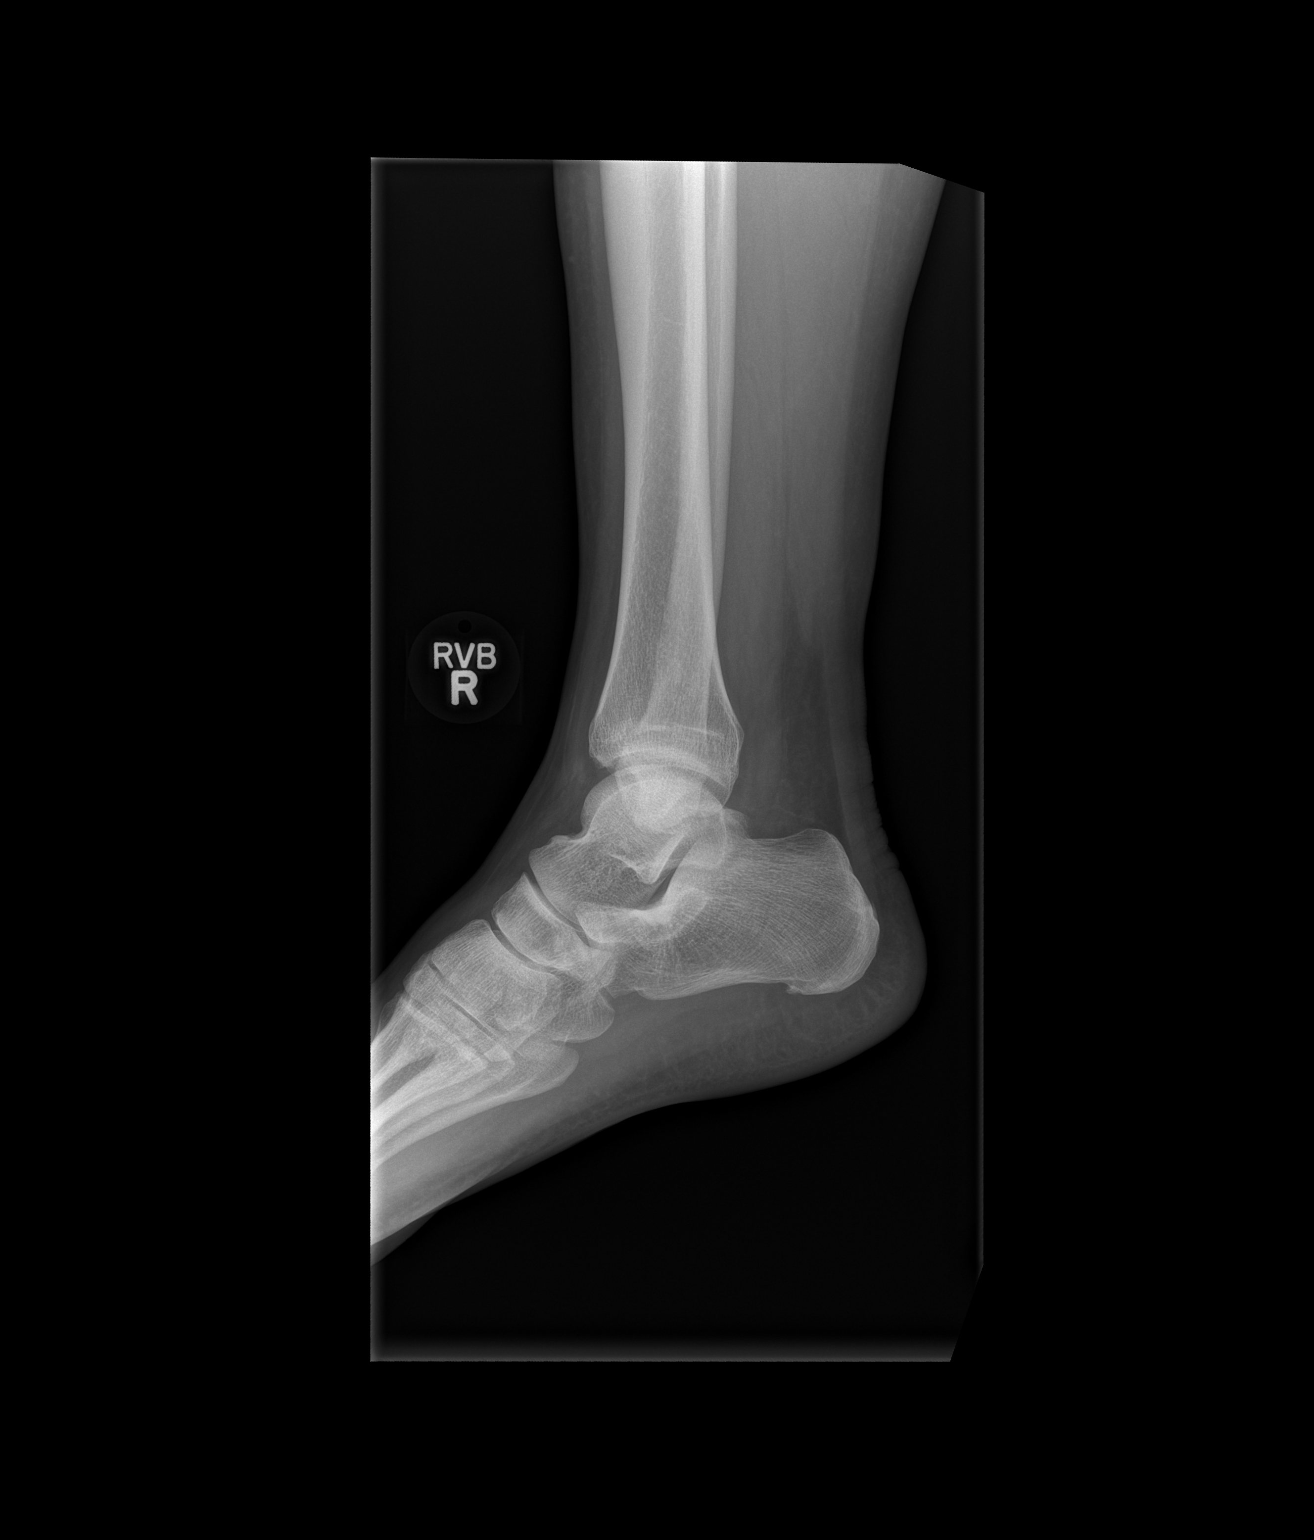

[3 of 3 positions shown; findings below may reference images not displayed]

FINDINGS: There is no evidence of fracture, dislocation, or joint effusion.
There is no evidence of arthropathy or other focal bone abnormality.
Soft tissues are unremarkable.
IMPRESSION: Negative.

## 2019-12-02 ENCOUNTER — Other Ambulatory Visit: Payer: Self-pay

## 2019-12-02 ENCOUNTER — Encounter (HOSPITAL_COMMUNITY): Payer: Self-pay | Admitting: Emergency Medicine

## 2019-12-02 ENCOUNTER — Emergency Department (HOSPITAL_COMMUNITY): Payer: Self-pay

## 2019-12-02 ENCOUNTER — Emergency Department (HOSPITAL_COMMUNITY)
Admission: EM | Admit: 2019-12-02 | Discharge: 2019-12-03 | Disposition: A | Discharge status: Home / Self Care | Payer: Self-pay | Attending: Emergency Medicine | Admitting: Emergency Medicine

## 2019-12-02 DIAGNOSIS — Z880 Allergy status to penicillin: Secondary | ICD-10-CM | POA: Insufficient documentation

## 2019-12-02 DIAGNOSIS — M79651 Pain in right thigh: Secondary | ICD-10-CM | POA: Insufficient documentation

## 2019-12-02 DIAGNOSIS — F17210 Nicotine dependence, cigarettes, uncomplicated: Secondary | ICD-10-CM | POA: Insufficient documentation

## 2019-12-02 DIAGNOSIS — N201 Calculus of ureter: Secondary | ICD-10-CM | POA: Insufficient documentation

## 2019-12-02 LAB — CBC
HCT: 43.5 % (ref 39.0–52.0)
Hemoglobin: 13.9 g/dL (ref 13.0–17.0)
MCH: 28.9 pg (ref 26.0–34.0)
MCHC: 32 g/dL (ref 30.0–36.0)
MCV: 90.4 fL (ref 80.0–100.0)
Platelets: 297 10*3/uL (ref 150–400)
RBC: 4.81 MIL/uL (ref 4.22–5.81)
RDW: 14 % (ref 11.5–15.5)
WBC: 14.1 10*3/uL — ABNORMAL HIGH (ref 4.0–10.5)
nRBC: 0 % (ref 0.0–0.2)

## 2019-12-02 LAB — COMPREHENSIVE METABOLIC PANEL WITH GFR
ALT: 32 U/L (ref 0–44)
AST: 20 U/L (ref 15–41)
Albumin: 4 g/dL (ref 3.5–5.0)
Alkaline Phosphatase: 111 U/L (ref 38–126)
Anion gap: 11 (ref 5–15)
BUN: 15 mg/dL (ref 6–20)
CO2: 26 mmol/L (ref 22–32)
Calcium: 9.1 mg/dL (ref 8.9–10.3)
Chloride: 102 mmol/L (ref 98–111)
Creatinine, Ser: 0.89 mg/dL (ref 0.61–1.24)
GFR calc Af Amer: >60 mL/min (ref >60)
GFR calc non Af Amer: >60 mL/min (ref >60)
Glucose, Bld: 89 mg/dL (ref 70–99)
Potassium: 3.6 mmol/L (ref 3.5–5.1)
Sodium: 139 mmol/L (ref 135–145)
Total Bilirubin: 0.7 mg/dL (ref 0.3–1.2)
Total Protein: 8.2 g/dL — ABNORMAL HIGH (ref 6.5–8.1)

## 2019-12-02 LAB — URINALYSIS, ROUTINE W REFLEX MICROSCOPIC
Bacteria, UA: NONE SEEN
Bilirubin Urine: NEGATIVE
Glucose, UA: NEGATIVE mg/dL
Ketones, ur: NEGATIVE mg/dL
Leukocytes,Ua: NEGATIVE
Nitrite: POSITIVE — AB
Protein, ur: NEGATIVE mg/dL
RBC / HPF: >50 RBC/hpf — ABNORMAL HIGH (ref 0–5)
Specific Gravity, Urine: 1.02 (ref 1.005–1.030)
pH: 5 (ref 5.0–8.0)

## 2019-12-02 LAB — LIPASE, BLOOD: Lipase: 45 U/L (ref 11–51)

## 2019-12-02 MED ORDER — KETOROLAC TROMETHAMINE 60 MG/2ML IM SOLN
60.0000 mg | Freq: Once | INTRAMUSCULAR | Status: AC
  Administered 2019-12-02: 60 mg via INTRAMUSCULAR
  Filled 2019-12-02: qty 2

## 2019-12-02 NOTE — ED Triage Notes (Signed)
Patient c/o right groin pain radiating down right leg x3 weeks. Reports change in urine stream. Denies hematuria.

## 2019-12-02 NOTE — ED Notes (Signed)
Lab contacted to add on urine culture. 

## 2019-12-03 MED ORDER — HYDROCODONE-ACETAMINOPHEN 5-325 MG PO TABS: 1.0000 | ORAL_TABLET | ORAL | 0 refills | Status: DC | PRN

## 2019-12-03 NOTE — Discharge Instructions (Addendum)
You have a 6 mm stone in your right ureter.  Call Urology for follow up.  Get rechecked immediately if you develop fevers, uncontrolled pain, or cannot urinate.

## 2019-12-03 NOTE — ED Provider Notes (Addendum)
[facility]-EMERGENCY DEPT Provider Note   CSN: [id number] Arrival date & time: 12/02/19  1805     History Chief Complaint  Patient presents with  . Groin Pain    Elijah Arnold is a 44 y.o. male.  The history is provided by the patient and medical records. No language interpreter was used.  Groin Pain   Elijah Arnold is a 44 y.o. male who presents to the Emergency Department complaining of groin pain. He presents the emergency department complaining of three weeks of right groin and thigh pain. Pain is a constant pain that is worse with walking and moving. The pain radiates to his groin and abdomen. He feels like his urine stream has been weaker than usual over the last week. He denies any fevers, nausea, vomiting, dysuria, numbness, weakness, back pain. He works in Aeronautical engineer. He has no known medical problems.    Past Medical History:  Diagnosis Date  . Allergic reaction to bee sting     There are no problems to display for this patient.   Past Surgical History:  Procedure Laterality Date  . ABCESS DRAINAGE  03/2012  . APPENDECTOMY         No family history on file.  Social History   Tobacco Use  . Smoking status: Current Every Day Smoker    Packs/day: 0.50    Years: 20.00    Pack years: 10.00    Types: Cigarettes  Substance Use Topics  . Alcohol use: No  . Drug use: No    Home Medications Prior to Admission medications   Medication Sig Start Date End Date Taking? Authorizing Provider  acetaminophen (TYLENOL) 325 MG tablet Take 650 mg by mouth every 6 (six) hours as needed for mild pain or moderate pain.   Yes [provider]  EPINEPHrine (EPIPEN) 0.3 mg/0.3 mL IJ SOAJ injection Inject 0.3 mLs (0.3 mg total) into the muscle as needed. 12/07/13  Yes Rancour, Jeannett Senior, MD  diphenhydrAMINE (BENADRYL) 25 MG tablet Take 1 tablet (25 mg total) by mouth every 6 (six) hours. Patient not taking: Reported on 12/02/2019 12/07/13    Glynn Octave, MD  famotidine (PEPCID) 20 MG tablet Take 1 tablet (20 mg total) by mouth 2 (two) times daily. Patient not taking: Reported on 12/02/2019 12/07/13   Glynn Octave, MD  HYDROcodone-acetaminophen (NORCO/VICODIN) 5-325 MG tablet Take 1 tablet by mouth every 4 (four) hours as needed. 12/03/19   Tilden Fossa, MD  predniSONE (DELTASONE) 50 MG tablet 1 tablet PO daily Patient not taking: Reported on 12/02/2019 12/07/13   Glynn Octave, MD  traMADol (ULTRAM) 50 MG tablet Take 1 tablet (50 mg total) by mouth every 6 (six) hours as needed. Patient not taking: Reported on 12/02/2019 11/12/14   Earley Favor, NP    Allergies    Amoxicillin, Bee venom, Hydrochlorothiazide, and Penicillins  Review of Systems   Review of Systems  All other systems reviewed and are negative.   Physical Exam Updated Vital Signs BP (!) 124/94 (BP Location: Left Arm)   Pulse 60   Temp 98.1 F (36.7 C) (Oral)   Resp 15   Ht 5\' 8"  (1.727 m)   Wt 121.2 kg   SpO2 99%   BMI 40.61 kg/m   Physical Exam Vitals and nursing note reviewed.  Constitutional:      Appearance: He is well-developed.  HENT:     Head: Normocephalic and atraumatic.  Cardiovascular:     Rate and Rhythm: Normal rate and regular rhythm.  Heart sounds: No murmur heard.   Pulmonary:     Effort: Pulmonary effort is normal. No respiratory distress.     Breath sounds: Normal breath sounds.  Abdominal:     Palpations: Abdomen is soft.     Tenderness: There is no guarding or rebound.     Comments: There is tenderness to palpation over the lower abdomen, right greater than left.  Genitourinary:    Comments: No penile or scrotal lesions. There is no testicular swelling, tenderness or masses. No inguinal hernia. Musculoskeletal:     Comments: 2+ DP pulses bilaterally. There is tenderness to palpation over the right medial thigh, inguinal crease. 2+ femoral pulses bilaterally.  Skin:    General: Skin is warm and dry.    Neurological:     Mental Status: He is alert and oriented to person, place, and time.     Comments: Five out of five strength in all four extremities with sensation light touch intact in all four extremities.  Psychiatric:        Behavior: Behavior normal.     ED Results / Procedures / Treatments   Labs (all labs ordered are listed, but only abnormal results are displayed) Labs Reviewed  COMPREHENSIVE METABOLIC PANEL - Abnormal; Notable for the following components:      Result Value   Total Protein 8.2 (*)    All other components within normal limits  CBC - Abnormal; Notable for the following components:   WBC 14.1 (*)    All other components within normal limits  URINALYSIS, ROUTINE W REFLEX MICROSCOPIC - Abnormal; Notable for the following components:   Color, Urine AMBER (*)    Hgb urine dipstick LARGE (*)    Nitrite POSITIVE (*)    RBC / HPF >50 (*)    All other components within normal limits  URINE CULTURE  LIPASE, BLOOD    EKG None  Radiology CT Renal Stone Study  Result Date: 12/03/2019 CLINICAL DATA:  Right groin pain EXAM: CT ABDOMEN AND PELVIS WITHOUT CONTRAST TECHNIQUE: Multidetector CT imaging of the abdomen and pelvis was performed following the standard protocol without IV contrast. COMPARISON:  None. FINDINGS: Lower chest: The visualized heart size within normal limits. No pericardial fluid/thickening. No hiatal hernia. The visualized portions of the lungs are clear. Hepatobiliary: Although limited due to the lack of intravenous contrast, normal in appearance without gross focal abnormality. No evidence of calcified gallstones or biliary ductal dilatation. Pancreas:  Unremarkable.  No surrounding inflammatory changes. Spleen: Normal in size. Although limited due to the lack of intravenous contrast, normal in appearance. Adrenals/Urinary Tract: Both adrenal glands appear normal. Mild right pelvicaliectasis and ureterectasis is seen down to the level of the UVJ  where there is a 6 mm calculus present. There is also mild right perinephric and proximal periureteral fat stranding changes. There is punctate scattered lower pole right renal calculi noted. There is also punctate left-sided renal calculi seen the largest measuring 2 mm in the upper pole. No left-sided hydronephrosis. The bladder is unremarkable. Stomach/Bowel: The stomach, small bowel, and colon are normal in appearance. No inflammatory changes or obstructive findings. Scattered colonic diverticula is seen without diverticulitis. Vascular/Lymphatic: There are no enlarged abdominal or pelvic lymph nodes. Scattered aortic atherosclerotic calcifications are seen without aneurysmal dilatation. Reproductive: The prostate is unremarkable. Other: A small fat containing anterior umbilical hernia seen. Musculoskeletal: No acute or significant osseous findings. IMPRESSION: 6 mm right UVJ calculus causing mild right hydronephrosis with perinephric/periureteral stranding. Nonobstructing punctate bilateral renal  calculi Diverticulosis without diverticulitis Aortic Atherosclerosis (ICD10-I70.0). Electronically Signed   By: Jonna Clark M.D.   On: 12/03/2019 00:16    Procedures Procedures (including critical care time)  Medications Ordered in ED Medications  ketorolac (TORADOL) injection 60 mg (60 mg Intramuscular Given 12/02/19 2238)    ED Course  I have reviewed the triage vital signs and the nursing notes.  Pertinent labs & imaging results that were available during my care of the patient were reviewed by me and considered in my medical decision making (see chart for details).    MDM Rules/Calculators/A&P                         Patient here for evaluation of three weeks of progressive right groin and abdominal pain. He does have tenderness on examination without peritoneal findings. He is neurologically intact. There is no evidence of skin or soft tissue infection on examination. Testicular examination is  benign and not consistent with torsion, epididymitis. Urinalysis does have RBCs present, nitrate positive. Patient does note that his urine change color on ED arrival. Current urinalysis is not consistent with UTI, will send urine culture. Plan to obtain CT scan to rule out stone.  He was treated with Toradol for pain control during his ED stay.  CT scan demonstrates right UVJ stone.  On recheck patient's pain is improved.  Discussed home care for kidney stone, Urology follow up and return precautions.    Final Clinical Impression(s) / ED Diagnoses Final diagnoses:  Right ureteral stone    Rx / DC Orders ED Discharge Orders         Ordered    HYDROcodone-acetaminophen (NORCO/VICODIN) 5-325 MG tablet  Every 4 hours PRN     Discontinue  Reprint     12/03/19 0034           Tilden Fossa, MD 12/03/19 Burna Mortimer    Tilden Fossa, MD 12/03/19 [phone]

## 2019-12-04 LAB — URINE CULTURE: Culture: <10000 — AB

## 2019-12-14 ENCOUNTER — Other Ambulatory Visit: Payer: Self-pay | Admitting: Urology

## 2019-12-18 ENCOUNTER — Other Ambulatory Visit (HOSPITAL_COMMUNITY)
Admission: RE | Admit: 2019-12-18 | Discharge: 2019-12-18 | Disposition: A | Discharge status: Home / Self Care | Payer: HRSA Program | Source: Ambulatory Visit | Attending: Urology | Admitting: Urology

## 2019-12-18 DIAGNOSIS — Z01812 Encounter for preprocedural laboratory examination: Secondary | ICD-10-CM | POA: Insufficient documentation

## 2019-12-18 DIAGNOSIS — Z20822 Contact with and (suspected) exposure to covid-19: Secondary | ICD-10-CM | POA: Insufficient documentation

## 2019-12-18 LAB — SARS CORONAVIRUS 2 (TAT 6-24 HRS): SARS Coronavirus 2: NEGATIVE

## 2019-12-18 NOTE — Progress Notes (Addendum)
COVID Vaccine Completed: None Date COVID Vaccine completed: COVID vaccine manufacturer: Pfizer    [facility]   PCP - Pt reports that he doesn't have a PCP Cardiologist - N/A No back stimulator  Chest x-ray -  EKG -  Stress Test -  ECHO -  Cardiac Cath -   Sleep Study -  CPAP -   Fasting Blood Sugar -  Checks Blood Sugar _____ times a day  Blood Thinner Instructions: Aspirin Instructions: Last Dose:  Anesthesia review:   No SOB w/ ADL's   Patient denies shortness of breath, fever, cough and chest pain at PAT appointment   Patient verbalized understanding of instructions that were given to them at the PAT appointment. Patient was also instructed that they will need to review over the PAT instructions again at home before surgery.

## 2019-12-18 NOTE — Patient Instructions (Addendum)
DUE TO COVID-19 ONLY ONE VISITOR IS ALLOWED TO COME WITH YOU AND STAY IN THE WAITING ROOM ONLY DURING PRE OP AND PROCEDURE DAY OF SURGERY. THE 1 VISITOR MAY VISIT WITH YOU AFTER SURGERY IN YOUR PRIVATE ROOM DURING VISITING HOURS ONLY!  YOU HAD A COVID 19 TEST ON 12-18-19. CONTINUE THE QUARANTINE INSTRUCTIONS AS OUTLINED IN YOUR HANDOUT.                Elijah Arnold  12/18/2019   Your procedure is scheduled on: 12-20-19   Report to [facility] Main  Entrance    Report to admitting at 1:45 PM     Call this number if you have problems the morning of surgery [phone]    Remember: You may have a Clear Liquid Diet from Midnight until 9:45 AM. After 9:45 AM, nothing until after surgery.      CLEAR LIQUID DIET   Foods Allowed                                                                     Foods Excluded  Coffee and tea, regular and decaf                             liquids that you cannot  Plain Jell-O any favor except red or purple                                           see through such as: Fruit ices (not with fruit pulp)                                     milk, soups, orange juice  Iced Popsicles                                    All solid food Carbonated beverages, regular and diet                                    Cranberry, grape and apple juices Sports drinks like Gatorade Lightly seasoned clear broth or consume(fat free) Sugar, honey syrup   _____________________________________________________________________       Take these medicines the morning of surgery with A SIP OF WATER: Oxy IR, prn  BRUSH YOUR TEETH MORNING OF SURGERY AND RINSE YOUR MOUTH OUT, NO CHEWING GUM CANDY OR MINTS.                               You may not have any metal on your body including hair pins and              piercings     Do not wear jewelry, cologne, lotions, powders or deodorant  Men may shave face and neck.   Do not bring valuables to the  hospital. [facility] IS NOT             RESPONSIBLE   FOR VALUABLES.  Contacts, dentures or bridgework may not be worn into surgery.     Patients discharged the day of surgery will not be allowed to drive home. IF YOU ARE HAVING SURGERY AND GOING HOME THE SAME DAY, YOU MUST HAVE AN ADULT TO DRIVE YOU HOME AND BE WITH YOU FOR 24 HOURS. YOU MAY GO HOME BY TAXI OR UBER OR ORTHERWISE, BUT AN ADULT MUST ACCOMPANY YOU HOME AND STAY WITH YOU FOR 24 HOURS.  Name and phone number of your driver :Christella Scheuermann [phone]  Special Instructions: N/A              Please read over the following fact sheets you were given: _____________________________________________________________________             [facility] - Preparing for Surgery Before surgery, you can play an important role.  Because skin is not sterile, your skin needs to be as free of germs as possible.  You can reduce the number of germs on your skin by washing with CHG (chlorahexidine gluconate) soap before surgery.  CHG is an antiseptic cleaner which kills germs and bonds with the skin to continue killing germs even after washing. Please DO NOT use if you have an allergy to CHG or antibacterial soaps.  If your skin becomes reddened/irritated stop using the CHG and inform your nurse when you arrive at Short Stay. Do not shave (including legs and underarms) for at least 48 hours prior to the first CHG shower.  You may shave your face/neck. Please follow these instructions carefully:  1.  Shower with CHG Soap the night before surgery and the  morning of Surgery.  2.  If you choose to wash your hair, wash your hair first as usual with your  normal  shampoo.  3.  After you shampoo, rinse your hair and body thoroughly to remove the  shampoo.                           4.  Use CHG as you would any other liquid soap.  You can apply chg directly  to the skin and wash                       Gently with a scrungie or clean washcloth.  5.  Apply the CHG  Soap to your body ONLY FROM THE NECK DOWN.   Do not use on face/ open                           Wound or open sores. Avoid contact with eyes, ears mouth and genitals (private parts).                       Wash face,  Genitals (private parts) with your normal soap.             6.  Wash thoroughly, paying special attention to the area where your surgery  will be performed.  7.  Thoroughly rinse your body with warm water from the neck down.  8.  DO NOT shower/wash with your normal soap after using and rinsing off  the CHG Soap.  9.  Pat yourself dry with a clean towel.            10.  Wear clean pajamas.            11.  Place clean sheets on your bed the night of your first shower and do not  sleep with pets. Day of Surgery : Do not apply any lotions/deodorants the morning of surgery.  Please wear clean clothes to the hospital/surgery center.  FAILURE TO FOLLOW THESE INSTRUCTIONS MAY RESULT IN THE CANCELLATION OF YOUR SURGERY PATIENT SIGNATURE_________________________________  NURSE SIGNATURE__________________________________  ________________________________________________________________________

## 2019-12-19 ENCOUNTER — Other Ambulatory Visit: Payer: Self-pay

## 2019-12-19 ENCOUNTER — Encounter (HOSPITAL_COMMUNITY)
Admission: RE | Admit: 2019-12-19 | Discharge: 2019-12-19 | Disposition: A | Discharge status: Home / Self Care | Payer: Self-pay | Source: Ambulatory Visit | Attending: Urology | Admitting: Urology

## 2019-12-19 ENCOUNTER — Encounter (HOSPITAL_COMMUNITY): Payer: Self-pay

## 2019-12-19 DIAGNOSIS — Z01818 Encounter for other preprocedural examination: Secondary | ICD-10-CM | POA: Insufficient documentation

## 2019-12-19 LAB — BASIC METABOLIC PANEL
Anion gap: 10 (ref 5–15)
BUN: 9 mg/dL (ref 6–20)
CO2: 26 mmol/L (ref 22–32)
Calcium: 9.2 mg/dL (ref 8.9–10.3)
Chloride: 100 mmol/L (ref 98–111)
Creatinine, Ser: 0.7 mg/dL (ref 0.61–1.24)
GFR calc Af Amer: >60 mL/min (ref >60)
GFR calc non Af Amer: >60 mL/min (ref >60)
Glucose, Bld: 91 mg/dL (ref 70–99)
Potassium: 3.9 mmol/L (ref 3.5–5.1)
Sodium: 136 mmol/L (ref 135–145)

## 2019-12-19 LAB — CBC
HCT: 45.2 % (ref 39.0–52.0)
Hemoglobin: 14.3 g/dL (ref 13.0–17.0)
MCH: 28.7 pg (ref 26.0–34.0)
MCHC: 31.6 g/dL (ref 30.0–36.0)
MCV: 90.6 fL (ref 80.0–100.0)
Platelets: 284 10*3/uL (ref 150–400)
RBC: 4.99 MIL/uL (ref 4.22–5.81)
RDW: 13.9 % (ref 11.5–15.5)
WBC: 9.4 10*3/uL (ref 4.0–10.5)
nRBC: 0 % (ref 0.0–0.2)

## 2019-12-20 ENCOUNTER — Ambulatory Visit (HOSPITAL_COMMUNITY)
Admission: RE | Admit: 2019-12-20 | Discharge: 2019-12-20 | Disposition: A | Discharge status: Home / Self Care | Payer: Self-pay | Attending: Urology | Admitting: Urology

## 2019-12-20 ENCOUNTER — Other Ambulatory Visit: Payer: Self-pay

## 2019-12-20 ENCOUNTER — Ambulatory Visit (HOSPITAL_COMMUNITY): Payer: Self-pay | Admitting: Certified Registered Nurse Anesthetist

## 2019-12-20 ENCOUNTER — Encounter (HOSPITAL_COMMUNITY)
Admission: RE | Disposition: A | Discharge status: Home / Self Care | Payer: Self-pay | Source: Home / Self Care | Attending: Urology

## 2019-12-20 ENCOUNTER — Ambulatory Visit (HOSPITAL_COMMUNITY): Payer: Self-pay

## 2019-12-20 ENCOUNTER — Encounter (HOSPITAL_COMMUNITY): Payer: Self-pay | Admitting: Urology

## 2019-12-20 DIAGNOSIS — Z880 Allergy status to penicillin: Secondary | ICD-10-CM | POA: Insufficient documentation

## 2019-12-20 DIAGNOSIS — N201 Calculus of ureter: Secondary | ICD-10-CM | POA: Insufficient documentation

## 2019-12-20 DIAGNOSIS — Z87891 Personal history of nicotine dependence: Secondary | ICD-10-CM | POA: Insufficient documentation

## 2019-12-20 DIAGNOSIS — Z6841 Body Mass Index (BMI) 40.0 and over, adult: Secondary | ICD-10-CM | POA: Insufficient documentation

## 2019-12-20 DIAGNOSIS — Z91030 Bee allergy status: Secondary | ICD-10-CM | POA: Insufficient documentation

## 2019-12-20 DIAGNOSIS — E669 Obesity, unspecified: Secondary | ICD-10-CM | POA: Insufficient documentation

## 2019-12-20 DIAGNOSIS — Z888 Allergy status to other drugs, medicaments and biological substances status: Secondary | ICD-10-CM | POA: Insufficient documentation

## 2019-12-20 HISTORY — PX: HOLMIUM LASER APPLICATION: SHX5852

## 2019-12-20 HISTORY — PX: CYSTOSCOPY WITH RETROGRADE PYELOGRAM, URETEROSCOPY AND STENT PLACEMENT: SHX5789

## 2019-12-20 SURGERY — CYSTOURETEROSCOPY, WITH RETROGRADE PYELOGRAM AND STENT INSERTION
Anesthesia: General | Laterality: Right

## 2019-12-20 MED ORDER — PROMETHAZINE HCL 25 MG/ML IJ SOLN: 6.2500 mg | INTRAMUSCULAR | Status: DC | PRN

## 2019-12-20 MED ORDER — ACETAMINOPHEN 10 MG/ML IV SOLN: 1000.0000 mg | Freq: Once | INTRAVENOUS | Status: DC | PRN

## 2019-12-20 MED ORDER — PROPOFOL 10 MG/ML IV BOLUS
INTRAVENOUS | Status: DC | PRN
  Administered 2019-12-20: 20 mg via INTRAVENOUS
  Administered 2019-12-20: 50 mg via INTRAVENOUS
  Administered 2019-12-20: 200 mg via INTRAVENOUS

## 2019-12-20 MED ORDER — ESMOLOL HCL 100 MG/10ML IV SOLN
INTRAVENOUS | Status: DC | PRN
  Administered 2019-12-20 (×2): 15 mg via INTRAVENOUS

## 2019-12-20 MED ORDER — ACETAMINOPHEN 325 MG PO TABS: 325.0000 mg | ORAL_TABLET | Freq: Once | ORAL | Status: DC | PRN

## 2019-12-20 MED ORDER — MIDAZOLAM HCL 2 MG/2ML IJ SOLN
INTRAMUSCULAR | Status: AC
  Filled 2019-12-20: qty 2

## 2019-12-20 MED ORDER — LACTATED RINGERS IV SOLN: INTRAVENOUS | Status: DC

## 2019-12-20 MED ORDER — OXYBUTYNIN CHLORIDE 5 MG PO TABS: 5.0000 mg | ORAL_TABLET | Freq: Three times a day (TID) | ORAL | 1 refills | Status: AC | PRN

## 2019-12-20 MED ORDER — SODIUM CHLORIDE 0.9 % IR SOLN
Status: DC | PRN
  Administered 2019-12-20: 3000 mL via INTRAVESICAL

## 2019-12-20 MED ORDER — KETOROLAC TROMETHAMINE 30 MG/ML IJ SOLN
INTRAMUSCULAR | Status: AC
  Filled 2019-12-20: qty 1

## 2019-12-20 MED ORDER — ONDANSETRON 4 MG PO TBDP
ORAL_TABLET | ORAL | Status: AC
  Filled 2019-12-20: qty 2

## 2019-12-20 MED ORDER — SULFAMETHOXAZOLE-TRIMETHOPRIM 800-160 MG PO TABS: 1.0000 | ORAL_TABLET | Freq: Two times a day (BID) | ORAL | 0 refills | Status: AC

## 2019-12-20 MED ORDER — FENTANYL CITRATE (PF) 100 MCG/2ML IJ SOLN
INTRAMUSCULAR | Status: AC
  Filled 2019-12-20: qty 2

## 2019-12-20 MED ORDER — PROPOFOL 10 MG/ML IV BOLUS
INTRAVENOUS | Status: AC
  Filled 2019-12-20: qty 20

## 2019-12-20 MED ORDER — LIDOCAINE 2% (20 MG/ML) 5 ML SYRINGE
INTRAMUSCULAR | Status: DC | PRN
  Administered 2019-12-20: 50 mg via INTRAVENOUS

## 2019-12-20 MED ORDER — DEXAMETHASONE SODIUM PHOSPHATE 10 MG/ML IJ SOLN
INTRAMUSCULAR | Status: DC | PRN
  Administered 2019-12-20: 10 mg via INTRAVENOUS

## 2019-12-20 MED ORDER — LIDOCAINE 2% (20 MG/ML) 5 ML SYRINGE
INTRAMUSCULAR | Status: AC
  Filled 2019-12-20: qty 5

## 2019-12-20 MED ORDER — MEPERIDINE HCL 50 MG/ML IJ SOLN: 6.2500 mg | INTRAMUSCULAR | Status: DC | PRN

## 2019-12-20 MED ORDER — KETOROLAC TROMETHAMINE 15 MG/ML IJ SOLN
INTRAMUSCULAR | Status: DC | PRN
  Administered 2019-12-20: 15 mg via INTRAVENOUS

## 2019-12-20 MED ORDER — 0.9 % SODIUM CHLORIDE (POUR BTL) OPTIME
TOPICAL | Status: DC | PRN
  Administered 2019-12-20: 1000 mL

## 2019-12-20 MED ORDER — ONDANSETRON HCL 4 MG/2ML IJ SOLN
INTRAMUSCULAR | Status: AC
  Filled 2019-12-20: qty 2

## 2019-12-20 MED ORDER — IOHEXOL 300 MG/ML  SOLN
INTRAMUSCULAR | Status: DC | PRN
  Administered 2019-12-20: 10 mL

## 2019-12-20 MED ORDER — FENTANYL CITRATE (PF) 100 MCG/2ML IJ SOLN: 25.0000 ug | INTRAMUSCULAR | Status: DC | PRN

## 2019-12-20 MED ORDER — ONDANSETRON HCL 4 MG/2ML IJ SOLN
INTRAMUSCULAR | Status: DC | PRN
  Administered 2019-12-20: 4 mg via INTRAVENOUS

## 2019-12-20 MED ORDER — MIDAZOLAM HCL 5 MG/5ML IJ SOLN
INTRAMUSCULAR | Status: DC | PRN
  Administered 2019-12-20: 2 mg via INTRAVENOUS

## 2019-12-20 MED ORDER — CIPROFLOXACIN IN D5W 400 MG/200ML IV SOLN
400.0000 mg | INTRAVENOUS | Status: AC
  Administered 2019-12-20: 400 mg via INTRAVENOUS

## 2019-12-20 MED ORDER — FENTANYL CITRATE (PF) 100 MCG/2ML IJ SOLN
INTRAMUSCULAR | Status: DC | PRN
  Administered 2019-12-20 (×4): 50 ug via INTRAVENOUS

## 2019-12-20 MED ORDER — ACETAMINOPHEN 160 MG/5ML PO SOLN: 325.0000 mg | Freq: Once | ORAL | Status: DC | PRN

## 2019-12-20 MED ORDER — CIPROFLOXACIN IN D5W 400 MG/200ML IV SOLN
INTRAVENOUS | Status: AC
  Filled 2019-12-20: qty 200

## 2019-12-20 MED ORDER — ONDANSETRON 4 MG PO TBDP
8.0000 mg | ORAL_TABLET | Freq: Once | ORAL | Status: AC
  Administered 2019-12-20: 8 mg via ORAL

## 2019-12-20 MED FILL — SULFAMETHOXAZOLE-TMP DS TAB: 800-160 | 5 days supply | Qty: 10 | Fill #0

## 2019-12-20 MED FILL — OXYBUTYNIN CHLORIDE 5 MG TA: 5 | 5 days supply | Qty: 15 | Fill #0

## 2019-12-20 SURGICAL SUPPLY — 25 items
BAG URO CATCHER STRL LF (MISCELLANEOUS) ×3 IMPLANT
CATH INTERMIT  6FR 70CM (CATHETERS) IMPLANT
CLOTH BEACON ORANGE TIMEOUT ST (SAFETY) ×3 IMPLANT
COVER SURGICAL LIGHT HANDLE (MISCELLANEOUS) ×3 IMPLANT
COVER WAND RF STERILE (DRAPES) IMPLANT
EXTRACTOR STONE NITINOL NGAGE (UROLOGICAL SUPPLIES) ×3 IMPLANT
FIBER LASER FLEXIVA 365 (UROLOGICAL SUPPLIES) ×3 IMPLANT
FIBER LASER TRAC TIP (UROLOGICAL SUPPLIES) IMPLANT
GLOVE BIOGEL M 8.0 STRL (GLOVE) ×3 IMPLANT
GOWN STRL REUS W/TWL XL LVL3 (GOWN DISPOSABLE) ×3 IMPLANT
GUIDEWIRE ANG ZIPWIRE 038X150 (WIRE) IMPLANT
GUIDEWIRE STR DUAL SENSOR (WIRE) ×6 IMPLANT
IV NS 1000ML (IV SOLUTION) ×2
IV NS 1000ML BAXH (IV SOLUTION) ×1 IMPLANT
KIT TURNOVER KIT A (KITS) IMPLANT
MANIFOLD NEPTUNE II (INSTRUMENTS) ×3 IMPLANT
PACK CYSTO (CUSTOM PROCEDURE TRAY) ×3 IMPLANT
PENCIL SMOKE EVACUATOR (MISCELLANEOUS) IMPLANT
SHEATH URETERAL 12FRX28CM (UROLOGICAL SUPPLIES) ×3 IMPLANT
SHEATH URETERAL 12FRX35CM (MISCELLANEOUS) IMPLANT
SHEATH URETERAL 12FRX55CM (UROLOGICAL SUPPLIES) IMPLANT
STENT URET 6FRX26 CONTOUR (STENTS) ×3 IMPLANT
TUBING CONNECTING 10 (TUBING) ×2 IMPLANT
TUBING CONNECTING 10' (TUBING) ×1
TUBING UROLOGY SET (TUBING) ×3 IMPLANT

## 2019-12-20 NOTE — Op Note (Signed)
Preoperative diagnosis: Persistent 6 mm right distal ureteral calculus  Postoperative diagnosis: Same  Principal procedure: Cystoscopy, right retrograde ureteropyelogram, fluoroscopic interpretation, right ureteroscopy, holmium laser lithotripsy and extraction of right distal ureteral stone, placement of 6 French by 26 cm contour double-J stent with tether  Surgeon: Wrigley Plasencia  Anesthesia: General with LMA  Complications: None  Specimen: Stone fragments  Drains: 26 cm x 6 French contour double-J stent with tether  Estimated blood loss: None  Indications: 44 year old male with a 6 mm distal ureteral stone which has not passed with medical expulsive therapy.  As he has been significantly symptomatic, he has requested management.  I discussed management options including lithotripsy and ureteroscopic lithotripsy.  He is chosen the latter.  I discussed the procedure as well as risks and complications which include but are not limited to infection, stent pain, the need for stent, anesthetic complications, ureteral injury, among others.  He understands these and desires to proceed.  Description of procedure: The patient was properly identified and marked in the holding area.  He received preoperative IV antibiotics.  He was taken to the operating room where general anesthetic was administered with LMA.  Is placed in the dorsolithotomy position.  Genitalia and perineum were prepped and draped.  Proper timeout was performed.  21 French panendoscope was advanced into his bladder under direct vision.  Prostate was nonobstructive, urethra was without lesions.  Bladder was inspected circumferentially.  No tumors foreign bodies or trabeculations were noted.  Ureteral orifices were normal in location and configuration.  Right retrograde ureteropyelogram was then performed using a 6 [place] open-ended catheter with Omnipaque.  This showed a distal ureteral filling defect consistent with stone with mild  proximal hydroureteronephrosis.  Following this, sensor tip guidewire was advanced by the stone and up into the upper pole calyceal system.  The scope was removed.  The ureteral orifice was sequentially dilated first with the obturator and then the entire 12/14 ureteral access catheter.  This was then removed.  I then passed the dual-lumen semirigid short ureteroscope up to the stone.  I tried grasping and extracting with the engage basket but it was too large to pass through the ureter.  I then fragmented it with 365 m fiber with holmium laser energy set at 0.5 J and 30 Hz.  Smaller fragments were easily grasped and brought into the bladder and dropped.  Inspection of the entire ureter at this time revealed no further stone fragments.  The ureteroscope was removed.  Cystoscope was then put in the bladder and small fragments were drained.  I then backloaded the guidewire through the scope, and then passed a 26 cm x 6 French contour double-J stent with a tether left on.  It was deployed in the ureter using fluoroscopic and cystoscopic guidance with good proximal and distal curl seen after the wire was removed.  The bladder was then drained.  The scope was removed.  The tether was brought out through the urethra and tied right outside the meatus.  Was then trimmed and taped to his penis.  This point, the procedure was terminated.  The patient was awakened and taken to the PACU in stable condition, having tolerated the procedure well.

## 2019-12-20 NOTE — Discharge Instructions (Signed)
1. You may see some blood in the urine and may have some burning with urination for 48-72 hours. You also may notice that you have to urinate more frequently or urgently after your procedure which is normal.  2. You should call should you develop an inability urinate, fever > 101, persistent nausea and vomiting that prevents you from eating or drinking to stay hydrated.  3. If you have a stent, you will likely urinate more frequently and urgently until the stent is removed and you may experience some discomfort/pain in the lower abdomen and flank especially when urinating. You may take pain medication prescribed to you if needed for pain. You may also intermittently have blood in the urine until the stent is removed.  It is okay to remove the stent by pulling on the thread on Monday morning. Marland Kitchen

## 2019-12-20 NOTE — Anesthesia Preprocedure Evaluation (Addendum)
Anesthesia Evaluation  Patient identified by MRN, date of birth, ID band Patient awake    Reviewed: Allergy & Precautions, NPO status , Patient's Chart, lab work & pertinent test results  Airway Mallampati: I  TM Distance: >3 FB Neck ROM: Full    Dental  (+) Dental Advisory Given, Missing, Poor Dentition, Chipped,    Pulmonary former smoker,    breath sounds clear to auscultation       Cardiovascular negative cardio ROS   Rhythm:Regular Rate:Normal     Neuro/Psych    GI/Hepatic Neg liver ROS,   Endo/Other    Renal/GU      Musculoskeletal   Abdominal (+) + obese,   Peds  Hematology   Anesthesia Other Findings   Reproductive/Obstetrics                            Anesthesia Physical Anesthesia Plan  ASA: III  Anesthesia Plan: General   Post-op Pain Management:    Induction: Intravenous  PONV Risk Score and Plan: 3 and Ondansetron, Dexamethasone and Midazolam  Airway Management Planned: LMA  Additional Equipment: None  Intra-op Plan:   Post-operative Plan: Extubation in OR  Informed Consent: I have reviewed the patients History and Physical, chart, labs and discussed the procedure including the risks, benefits and alternatives for the proposed anesthesia with the patient or authorized representative who has indicated his/her understanding and acceptance.     Dental advisory given  Plan Discussed with: CRNA  Anesthesia Plan Comments: (Anesthetic plan discussed in detail. Associated risk discussed including but not limited to life threatening cardiovascular, pulmonary events and dental damage. The postoperative pain management and antiemetic plan discussed with patient. All questions answered in detail. Patient is in agreement.     We also discussed finding a PCP for more routine care. )       Anesthesia Quick Evaluation

## 2019-12-20 NOTE — Anesthesia Postprocedure Evaluation (Signed)
Anesthesia Post Note  Patient: Elijah Arnold  Procedure(s) Performed: CYSTOSCOPY WITH RETROGRADE PYELOGRAM, URETEROSCOPY AND STENT PLACEMENT (Right ) HOLMIUM LASER APPLICATION (Right )     Patient location during evaluation: PACU Anesthesia Type: General Level of consciousness: awake and alert Pain management: pain level controlled Vital Signs Assessment: post-procedure vital signs reviewed and stable Respiratory status: spontaneous breathing, nonlabored ventilation, respiratory function stable and patient connected to nasal cannula oxygen Cardiovascular status: blood pressure returned to baseline and stable Postop Assessment: no apparent nausea or vomiting Anesthetic complications: no   No complications documented.  Last Vitals:  Vitals:   12/20/19 1700 12/20/19 1715  BP: 138/89 (!) 145/108  Pulse: 63 65  Resp: 16 16  Temp: 36.7 C   SpO2: 96% 96%    Last Pain:  Vitals:   12/20/19 1715  TempSrc:   PainSc: 5                  Shelton Silvas

## 2019-12-20 NOTE — H&P (Signed)
H&P  Chief Complaint: Kidney stone  History of Present Illness: 44 yo male presents for RT URS for mgmt of a persistently symptomatic Rt dital ureteral stone.  Past Medical History:  Diagnosis Date  . Allergic reaction to bee sting     Past Surgical History:  Procedure Laterality Date  . ABCESS DRAINAGE  03/2012  . APPENDECTOMY      Home Medications:  Allergies as of 12/20/2019      Reactions   Amoxicillin Anaphylaxis, Hives, Swelling   Bee Venom Anaphylaxis   Hydrochlorothiazide Anaphylaxis   Penicillins Anaphylaxis, Hives, Swelling      Medication List    Notice   Cannot display discharge medications because the patient has not yet been admitted.     Allergies:  Allergies  Allergen Reactions  . Amoxicillin Anaphylaxis, Hives and Swelling  . Bee Venom Anaphylaxis  . Hydrochlorothiazide Anaphylaxis  . Penicillins Anaphylaxis, Hives and Swelling    No family history on file.  Social History:  reports that he has quit smoking. His smoking use included cigarettes. He has a 10.00 pack-year smoking history. He has never used smokeless tobacco. He reports that he does not drink alcohol and does not use drugs.  ROS: A complete review of systems was performed.  All systems are negative except for pertinent findings as noted.  Physical Exam:  Vital signs in last 24 hours: There were no vitals taken for this visit. Constitutional:  Alert and oriented, No acute distress Cardiovascular: Regular rate  Respiratory: Normal respiratory effort GI: Abdomen is soft, nontender, nondistended, no abdominal masses. No CVAT.  Genitourinary: Normal male phallus, testes are descended bilaterally and non-tender and without masses, scrotum is normal in appearance without lesions or masses, perineum is normal on inspection. Lymphatic: No lymphadenopathy Neurologic: Grossly intact, no focal deficits Psychiatric: Normal mood and affect  Laboratory Data:  Recent Labs    12/19/19 1157   WBC 9.4  HGB 14.3  HCT 45.2  PLT 284    Recent Labs    12/19/19 1157  NA 136  K 3.9  CL 100  GLUCOSE 91  BUN 9  CALCIUM 9.2  CREATININE 0.70     Results for orders placed or performed during the hospital encounter of 12/19/19 (from the past 24 hour(s))  CBC     Status: None   Collection Time: 12/19/19 11:57 AM  Result Value Ref Range   WBC 9.4 4.0 - 10.5 K/uL   RBC 4.99 4.22 - 5.81 MIL/uL   Hemoglobin 14.3 13.0 - 17.0 g/dL   HCT 53.6 39 - 52 %   MCV 90.6 80.0 - 100.0 fL   MCH 28.7 26.0 - 34.0 pg   MCHC 31.6 30.0 - 36.0 g/dL   RDW 46.8 03.2 - 12.2 %   Platelets 284 150 - 400 K/uL   nRBC 0.0 0.0 - 0.2 %  Basic metabolic panel     Status: None   Collection Time: 12/19/19 11:57 AM  Result Value Ref Range   Sodium 136 135 - 145 mmol/L   Potassium 3.9 3.5 - 5.1 mmol/L   Chloride 100 98 - 111 mmol/L   CO2 26 22 - 32 mmol/L   Glucose, Bld 91 70 - 99 mg/dL   BUN 9 6 - 20 mg/dL   Creatinine, Ser 4.82 0.61 - 1.24 mg/dL   Calcium 9.2 8.9 - 50.0 mg/dL   GFR calc non Af Amer >60 >60 mL/min   GFR calc Af Amer >60 >60 mL/min  Anion gap 10 5 - 15   Recent Results (from the past 240 hour(s))  SARS CORONAVIRUS 2 (TAT 6-24 HRS) Nasopharyngeal Nasopharyngeal Swab     Status: None   Collection Time: 12/18/19 12:05 PM   Specimen: Nasopharyngeal Swab  Result Value Ref Range Status   SARS Coronavirus 2 NEGATIVE NEGATIVE Final    Comment: (NOTE) SARS-CoV-2 target nucleic acids are NOT DETECTED.  The SARS-CoV-2 RNA is generally detectable in upper and lower respiratory specimens during the acute phase of infection. Negative results do not preclude SARS-CoV-2 infection, do not rule out co-infections with other pathogens, and should not be used as the sole basis for treatment or other patient management decisions. Negative results must be combined with clinical observations, patient history, and epidemiological information. The expected result is Negative.  Fact Sheet for  Patients: HairSlick.no  Fact Sheet for Healthcare Providers: quierodirigir.com  This test is not yet approved or cleared by the [place] FDA and  has been authorized for detection and/or diagnosis of SARS-CoV-2 by FDA under an Emergency Use Authorization (EUA). This EUA will remain  in effect (meaning this test can be used) for the duration of the COVID-19 declaration under Se ction 564(b)(1) of the Act, 21 U.S.C. section 360bbb-3(b)(1), unless the authorization is terminated or revoked sooner.  Performed at [facility], [address], [place], [place] [address]     Renal Function: Recent Labs    12/19/19 1157  CREATININE 0.70   Estimated Creatinine Clearance: 151.9 mL/min (by C-G formula based on SCr of 0.7 mg/dL).  Radiologic Imaging: No results found.  Impression/Assessment:  Rt dital ureteral stone   Plan:  Rt URS, HLL, extraction of stone, J2 stent placement.

## 2019-12-20 NOTE — Transfer of Care (Signed)
Immediate Anesthesia Transfer of Care Note  Patient: Elijah Arnold  Procedure(s) Performed: CYSTOSCOPY WITH RETROGRADE PYELOGRAM, URETEROSCOPY AND STENT PLACEMENT (Right ) HOLMIUM LASER APPLICATION (Right )  Patient Location: PACU  Anesthesia Type:General  Level of Consciousness: awake, oriented, patient cooperative and responds to stimulation  Airway & Oxygen Therapy: Patient Spontanous Breathing and Patient connected to face mask oxygen  Post-op Assessment: Report given to RN and Post -op Vital signs reviewed and stable  Post vital signs: Reviewed and stable  Last Vitals:  Vitals Value Taken Time  BP 141/95 12/20/19 1639  Temp 36.7 C 12/20/19 1639  Pulse 76 12/20/19 1641  Resp 29 12/20/19 1641  SpO2 98 % 12/20/19 1641  Vitals shown include unvalidated device data.  Last Pain:  Vitals:   12/20/19 1334  TempSrc:   PainSc: 8       Patients Stated Pain Goal: 4 (12/20/19 1334)  Complications: No complications documented.

## 2019-12-20 NOTE — Anesthesia Procedure Notes (Signed)
Procedure Name: LMA Insertion Date/Time: 12/20/2019 3:59 PM Performed by: Jorene Minors, CRNA Pre-anesthesia Checklist: Patient identified, Emergency Drugs available, Suction available and Patient being monitored Patient Re-evaluated:Patient Re-evaluated prior to induction Oxygen Delivery Method: Circle system utilized Preoxygenation: Pre-oxygenation with 100% oxygen Induction Type: IV induction LMA: LMA inserted LMA Size: 5.0 Tube type: Oral Number of attempts: 1 Placement Confirmation: positive ETCO2 and breath sounds checked- equal and bilateral Tube secured with: Tape Dental Injury: Teeth and Oropharynx as per pre-operative assessment

## 2019-12-21 ENCOUNTER — Encounter (HOSPITAL_COMMUNITY): Payer: Self-pay | Admitting: Urology

## 2020-02-04 MED FILL — OXYBUTYNIN CHLORIDE 5 MG TA: 5 | 5 days supply | Qty: 15 | Fill #1
